# Patient Record
Sex: Female | Born: 1980
Health system: Southern US, Community
[De-identification: ages and names within clinical notes are randomized; demographics above are authoritative.]

## PROBLEM LIST (undated history)

## (undated) DIAGNOSIS — D649 Anemia, unspecified: Secondary | ICD-10-CM

## (undated) DIAGNOSIS — F419 Anxiety disorder, unspecified: Secondary | ICD-10-CM

## (undated) DIAGNOSIS — Z789 Other specified health status: Secondary | ICD-10-CM

## (undated) DIAGNOSIS — L409 Psoriasis, unspecified: Secondary | ICD-10-CM

## (undated) DIAGNOSIS — E785 Hyperlipidemia, unspecified: Secondary | ICD-10-CM

## (undated) HISTORY — DX: Hyperlipidemia, unspecified: E78.5

## (undated) HISTORY — DX: Anemia, unspecified: D64.9

## (undated) HISTORY — DX: Psoriasis, unspecified: L40.9

## (undated) HISTORY — PX: NO PAST SURGERIES: SHX2092

## (undated) HISTORY — DX: Anxiety disorder, unspecified: F41.9

---
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
## 2003-08-19 ENCOUNTER — Other Ambulatory Visit: Admission: RE | Admit: 2003-08-19 | Discharge: 2003-08-19 | Payer: Self-pay | Admitting: Family Medicine

## 2004-09-08 ENCOUNTER — Other Ambulatory Visit: Admission: RE | Admit: 2004-09-08 | Discharge: 2004-09-08 | Payer: Self-pay | Admitting: Family Medicine

## 2009-05-27 ENCOUNTER — Other Ambulatory Visit: Admission: RE | Admit: 2009-05-27 | Discharge: 2009-05-27 | Payer: Self-pay | Admitting: Family Medicine

## 2011-03-10 ENCOUNTER — Other Ambulatory Visit (HOSPITAL_COMMUNITY)
Admission: RE | Admit: 2011-03-10 | Discharge: 2011-03-10 | Disposition: A | Discharge status: Home / Self Care | Payer: 59 | Source: Ambulatory Visit | Attending: Family Medicine | Admitting: Family Medicine

## 2011-03-10 ENCOUNTER — Other Ambulatory Visit: Payer: Self-pay | Admitting: Physician Assistant

## 2011-03-10 DIAGNOSIS — Z124 Encounter for screening for malignant neoplasm of cervix: Secondary | ICD-10-CM | POA: Insufficient documentation

## 2011-04-06 NOTE — L&D Delivery Note (Signed)
Operative Delivery Note At 1:11 PM a viable female was delivered via Vaginal, Vacuum Investment banker, operational).  Used with 2 sets of pushing.  No pop offs.  Pressure remained in green zone at all times.  Released when not pushing.  Presentation: Vertex, compound with left hand Position: Direct OA Station: +2.  Nuchal cord x 1.  Prominent caput.  Scalp abrasion 1 cm.  No bleeding.  Delivery of shoulders delayed slightly.  Left arm delivered, shoulders placed transverse and delivery was successful.  Cord cut and baby placed at bedside. Moving arms bilaterally.  Difficulty breathing, retractions after delivery.  NICU in to assess.  O2 sats normal.  Color good.  B/c breathing was improving, baby allowed to stay skin to skin with precautions.  Verbal consent: and husband.  Risks and benefits discussed in detail.  Risks include, but are not limited to the risks of anesthesia, bleeding, infection, damage to maternal tissues, fetal cephalhematoma.  There is also the risk of inability to effect vaginal delivery of the head, or shoulder dystocia that cannot be resolved by established maneuvers, leading to the need for emergency cesarean section.  APGAR: 7, 8; weight 7 lb 12 oz (3515 g).   Placenta status: Intact, Spontaneous.   Cord: 3 vessels with the following complications: None.  Cord pH: 7.25  Anesthesia: Epidural  Episiotomy: Not performed Lacerations: 2nd degree Suture Repair: 2.0 3.0 chromic Est. Blood Loss (mL): 300  Mom to postpartum.  Baby to skin to skin.Marland Kitchen  Geryl Rankins 02/02/2012, 2:13 PM

## 2011-06-14 LAB — OB RESULTS CONSOLE GC/CHLAMYDIA
Chlamydia: NEGATIVE
Gonorrhea: NEGATIVE

## 2011-06-14 LAB — OB RESULTS CONSOLE ABO/RH: RH Type: POSITIVE

## 2011-06-14 LAB — OB RESULTS CONSOLE HEPATITIS B SURFACE ANTIGEN: Hepatitis B Surface Ag: NEGATIVE

## 2011-06-14 LAB — OB RESULTS CONSOLE RUBELLA ANTIBODY, IGM: Rubella: IMMUNE

## 2011-07-26 ENCOUNTER — Other Ambulatory Visit: Payer: Self-pay | Admitting: Obstetrics and Gynecology

## 2011-07-26 DIAGNOSIS — E049 Nontoxic goiter, unspecified: Secondary | ICD-10-CM

## 2011-07-29 ENCOUNTER — Ambulatory Visit
Admission: RE | Admit: 2011-07-29 | Discharge: 2011-07-29 | Disposition: A | Discharge status: Home / Self Care | Payer: 59 | Source: Ambulatory Visit | Attending: Obstetrics and Gynecology | Admitting: Obstetrics and Gynecology

## 2011-07-29 DIAGNOSIS — E049 Nontoxic goiter, unspecified: Secondary | ICD-10-CM

## 2012-01-31 ENCOUNTER — Inpatient Hospital Stay (HOSPITAL_COMMUNITY)
Admission: AD | Admit: 2012-01-31 | Discharge: 2012-02-04 | DRG: 775 | Disposition: A | Discharge status: Home / Self Care | Payer: Managed Care, Other (non HMO) | Source: Ambulatory Visit | Attending: Obstetrics and Gynecology | Admitting: Obstetrics and Gynecology

## 2012-01-31 ENCOUNTER — Other Ambulatory Visit: Payer: Self-pay | Admitting: Obstetrics and Gynecology

## 2012-01-31 ENCOUNTER — Encounter (HOSPITAL_COMMUNITY): Payer: Self-pay | Admitting: *Deleted

## 2012-01-31 DIAGNOSIS — O139 Gestational [pregnancy-induced] hypertension without significant proteinuria, unspecified trimester: Principal | ICD-10-CM | POA: Diagnosis present

## 2012-01-31 HISTORY — DX: Other specified health status: Z78.9

## 2012-01-31 LAB — CBC
MCH: 28.4 pg (ref 26.0–34.0)
MCHC: 32.9 g/dL (ref 30.0–36.0)
MCV: 86.2 fL (ref 78.0–100.0)
Platelets: 253 10*3/uL (ref 150–400)
RDW: 14.8 % (ref 11.5–15.5)

## 2012-01-31 LAB — COMPREHENSIVE METABOLIC PANEL
AST: 14 U/L (ref 0–37)
Albumin: 2.4 g/dL — ABNORMAL LOW (ref 3.5–5.2)
BUN: 12 mg/dL (ref 6–23)
Calcium: 9.2 mg/dL (ref 8.4–10.5)
Creatinine, Ser: 0.55 mg/dL (ref 0.50–1.10)

## 2012-01-31 LAB — LACTATE DEHYDROGENASE: LDH: 179 U/L (ref 94–250)

## 2012-01-31 LAB — TYPE AND SCREEN: Antibody Screen: NEGATIVE

## 2012-01-31 LAB — URIC ACID: Uric Acid, Serum: 4.9 mg/dL (ref 2.4–7.0)

## 2012-01-31 MED ORDER — PHENYLEPHRINE 40 MCG/ML (10ML) SYRINGE FOR IV PUSH (FOR BLOOD PRESSURE SUPPORT)
80.0000 ug | PREFILLED_SYRINGE | INTRAVENOUS | Status: DC | PRN
  Filled 2012-01-31: qty 5

## 2012-01-31 MED ORDER — EPHEDRINE 5 MG/ML INJ
10.0000 mg | INTRAVENOUS | Status: DC | PRN
  Filled 2012-01-31: qty 4

## 2012-01-31 MED ORDER — CITRIC ACID-SODIUM CITRATE 334-500 MG/5ML PO SOLN
30.0000 mL | ORAL | Status: DC | PRN
  Administered 2012-02-01: 30 mL via ORAL
  Filled 2012-01-31: qty 15

## 2012-01-31 MED ORDER — MISOPROSTOL 25 MCG QUARTER TABLET
25.0000 ug | ORAL_TABLET | ORAL | Status: DC | PRN
  Administered 2012-01-31 – 2012-02-01 (×3): 25 ug via VAGINAL
  Filled 2012-01-31 (×3): qty 0.25

## 2012-01-31 MED ORDER — LACTATED RINGERS IV SOLN
INTRAVENOUS | Status: DC
  Administered 2012-01-31 – 2012-02-02 (×5): via INTRAVENOUS

## 2012-01-31 MED ORDER — DIPHENHYDRAMINE HCL 50 MG/ML IJ SOLN: 12.5000 mg | INTRAMUSCULAR | Status: DC | PRN

## 2012-01-31 MED ORDER — OXYTOCIN 40 UNITS IN LACTATED RINGERS INFUSION - SIMPLE MED: 1.0000 m[IU]/min | INTRAVENOUS | Status: DC

## 2012-01-31 MED ORDER — EPHEDRINE 5 MG/ML INJ: 10.0000 mg | INTRAVENOUS | Status: DC | PRN

## 2012-01-31 MED ORDER — FENTANYL 2.5 MCG/ML BUPIVACAINE 1/10 % EPIDURAL INFUSION (WH - ANES)
14.0000 mL/h | INTRAMUSCULAR | Status: DC
  Administered 2012-02-02: 14 mL/h via EPIDURAL
  Filled 2012-01-31 (×2): qty 125

## 2012-01-31 MED ORDER — PHENYLEPHRINE 40 MCG/ML (10ML) SYRINGE FOR IV PUSH (FOR BLOOD PRESSURE SUPPORT): 80.0000 ug | PREFILLED_SYRINGE | INTRAVENOUS | Status: DC | PRN

## 2012-01-31 MED ORDER — LACTATED RINGERS IV SOLN
500.0000 mL | Freq: Once | INTRAVENOUS | Status: AC
  Administered 2012-02-02: 500 mL via INTRAVENOUS

## 2012-01-31 MED ORDER — ACETAMINOPHEN 325 MG PO TABS
650.0000 mg | ORAL_TABLET | ORAL | Status: DC | PRN
  Administered 2012-02-02: 650 mg via ORAL
  Filled 2012-01-31: qty 2

## 2012-01-31 MED ORDER — LACTATED RINGERS IV SOLN: 500.0000 mL | INTRAVENOUS | Status: DC | PRN

## 2012-01-31 MED ORDER — ONDANSETRON HCL 4 MG/2ML IJ SOLN: 4.0000 mg | Freq: Four times a day (QID) | INTRAMUSCULAR | Status: DC | PRN

## 2012-01-31 MED ORDER — LIDOCAINE HCL (PF) 1 % IJ SOLN
30.0000 mL | INTRAMUSCULAR | Status: DC | PRN
  Filled 2012-01-31: qty 30

## 2012-01-31 MED ORDER — IBUPROFEN 600 MG PO TABS: 600.0000 mg | ORAL_TABLET | Freq: Four times a day (QID) | ORAL | Status: DC | PRN

## 2012-01-31 MED ORDER — TERBUTALINE SULFATE 1 MG/ML IJ SOLN: 0.2500 mg | Freq: Once | INTRAMUSCULAR | Status: AC | PRN

## 2012-01-31 MED ORDER — OXYTOCIN BOLUS FROM INFUSION
500.0000 mL | INTRAVENOUS | Status: DC
  Filled 2012-01-31 (×142): qty 500

## 2012-01-31 MED ORDER — BUTORPHANOL TARTRATE 1 MG/ML IJ SOLN
1.0000 mg | INTRAMUSCULAR | Status: DC | PRN
  Administered 2012-02-01 – 2012-02-02 (×5): 1 mg via INTRAVENOUS
  Filled 2012-01-31 (×6): qty 1

## 2012-01-31 MED ORDER — OXYTOCIN 40 UNITS IN LACTATED RINGERS INFUSION - SIMPLE MED: 62.5000 mL/h | INTRAVENOUS | Status: DC

## 2012-01-31 MED ORDER — FLEET ENEMA 7-19 GM/118ML RE ENEM: 1.0000 | ENEMA | RECTAL | Status: DC | PRN

## 2012-01-31 MED ORDER — ZOLPIDEM TARTRATE 5 MG PO TABS
5.0000 mg | ORAL_TABLET | Freq: Every evening | ORAL | Status: DC | PRN
  Administered 2012-01-31: 5 mg via ORAL
  Filled 2012-01-31: qty 1

## 2012-01-31 MED ORDER — OXYTOCIN 40 UNITS IN LACTATED RINGERS INFUSION - SIMPLE MED
1.0000 m[IU]/min | INTRAVENOUS | Status: DC
  Administered 2012-02-01: 2 m[IU]/min via INTRAVENOUS
  Administered 2012-02-02: 13:00:00 via INTRAVENOUS
  Filled 2012-01-31: qty 1000

## 2012-01-31 MED ORDER — OXYCODONE-ACETAMINOPHEN 5-325 MG PO TABS: 1.0000 | ORAL_TABLET | ORAL | Status: DC | PRN

## 2012-01-31 NOTE — Progress Notes (Signed)
Tracey Kent is a 31 y.o. G1P0000 at [redacted]w[redacted]d byadmitted for induction of labor due to Hypertension.  Subjective: Comfortable and no preeclamptic symptoms of HA N V RUQ Pain  Objective: BP 141/92  Pulse 108  Temp 98.9 F (37.2 C) (Oral)  Resp 18  Ht 5' 1.75" (1.568 m)  Wt 103.874 kg (229 lb)  BMI 42.22 kg/m2      FHT:  FHR: 140-150s bpm, variability: moderate,  accelerations:  Present,  decelerations:  Absent UC:   irregular, every 5-10 minutes SVE:   Dilation: Closed Effacement (%): 40 Station: -2 Exam by:: Cammy Copa, RN  Labs: Lab Results  Component Value Date   WBC 9.7 01/31/2012   HGB 11.1* 01/31/2012   HCT 33.7* 01/31/2012   MCV 86.2 01/31/2012   PLT 253 01/31/2012   CBC    Component Value Date/Time   WBC 9.7 01/31/2012 1550   RBC 3.91 01/31/2012 1550   HGB 11.1* 01/31/2012 1550   HCT 33.7* 01/31/2012 1550   PLT 253 01/31/2012 1550   MCV 86.2 01/31/2012 1550   MCH 28.4 01/31/2012 1550   MCHC 32.9 01/31/2012 1550   RDW 14.8 01/31/2012 1550   CMP     Component Value Date/Time   NA 136 01/31/2012 1550   K 4.2 01/31/2012 1550   CL 102 01/31/2012 1550   CO2 22 01/31/2012 1550   GLUCOSE 115* 01/31/2012 1550   BUN 12 01/31/2012 1550   CREATININE 0.55 01/31/2012 1550   CALCIUM 9.2 01/31/2012 1550   PROT 5.5* 01/31/2012 1550   ALBUMIN 2.4* 01/31/2012 1550   AST 14 01/31/2012 1550   ALT 11 01/31/2012 1550   ALKPHOS 97 01/31/2012 1550   BILITOT 0.1* 01/31/2012 1550   GFRNONAA >90 01/31/2012 1550   GFRAA >90 01/31/2012 1550       Assessment / Plan: cervical ripening in unfavorable cervix with PIH  Labor: early  Preeclampsia:  24 hour urine pending Fetal Wellbeing:  Category I Pain Control:    I/D:  n/a Anticipated MOD:     Amanada Philbrick H. 01/31/2012, 10:49 PM

## 2012-02-01 ENCOUNTER — Encounter (HOSPITAL_COMMUNITY): Payer: Self-pay | Admitting: *Deleted

## 2012-02-01 LAB — RPR: RPR Ser Ql: NONREACTIVE

## 2012-02-01 LAB — CBC
HCT: 34 % — ABNORMAL LOW (ref 36.0–46.0)
Hemoglobin: 11.4 g/dL — ABNORMAL LOW (ref 12.0–15.0)
MCH: 28.9 pg (ref 26.0–34.0)
RBC: 3.94 MIL/uL (ref 3.87–5.11)

## 2012-02-01 NOTE — Progress Notes (Signed)
Discussed with Dr. Christell Constant regarding contraction pattern and inability to place next cytotec due to protocol. Dr. Christell Constant stated that he would continue to watch.

## 2012-02-01 NOTE — H&P (Signed)
Tracey Kent is a 31 y.o. female G1 at 1 6/7 weeks with h/o Gestational HTN admitted for induction of labor.  GHTN diagnosed at 37+ weeks.  NST twice weekly have been reassuring and reactive.  AFI one week ago was 18 cm, BPP 8/8.  Labs have been normal.  Initial 24 hour urine was 152, urine dips have been negative for protein x 3 weeks.  Pt has been without symptoms of preeclampsia.  Today, she continues to deny having headaches, visual changes or RUQ pain. Pt has had contractions irregularly on her NSTs.  Today in the office she reports feeling contractions but mild.  Denis LOF, VB.  She reports fetus has been active. Prior to Gestational HTN, her pregnancy was uncomplicated.     Maternal Medical History:  Contractions: Onset was more than 2 days ago.   Frequency: irregular.   Perceived severity is mild.    Fetal activity: Perceived fetal activity is normal.    Prenatal complications: Hypertension.   Prenatal Complications - Diabetes: none.    OB History    Grav Para Term Preterm Abortions TAB SAB Ect Mult Living   1 0 0 0 0 0 0 0 0 0      Past Medical History  Diagnosis Date  . No pertinent past medical history    Past Surgical History  Procedure Date  . No past surgeries    Family History: family history is not on file. Social History:  reports that she has never smoked. She has never used smokeless tobacco. She reports that she does not drink alcohol or use illicit drugs.   Prenatal Transfer Tool  Maternal Diabetes: No Genetic Screening: Declined Maternal Ultrasounds/Referrals: Normal Fetal Ultrasounds or other Referrals:  None Maternal Substance Abuse:  No Significant Maternal Medications:  None Significant Maternal Lab Results:  Lab values include: Group B Strep negative Other Comments:  Pt with Gestational HTN at 37 weeks.  Review of Systems  Eyes: Negative for blurred vision.  Respiratory: Negative for shortness of breath.   Gastrointestinal: Negative for  abdominal pain.  Neurological: Negative for headaches.    Dilation: Fingertip Effacement (%): 50 Station: -2 Exam by:: Cammy Copa, RN Blood pressure 141/92, pulse 108, temperature 98.9 F (37.2 C), temperature source Oral, resp. rate 18, height 5' 1.75" (1.568 m), weight 103.874 kg (229 lb). Maternal Exam:  Uterine Assessment: Contraction frequency is irregular.   Abdomen: Estimated fetal weight is 8 1/2 pounds.   Fetal presentation: vertex  Introitus: Normal vulva. Vulva is negative for condylomata and lesion.  Normal vagina.  Ferning test: not done.  Nitrazine test: not done. Amniotic fluid character: not assessed.  Pelvis: questionable for delivery.   May deliver 7-8 pound baby but not larger. Cervix: Cervix evaluated by digital exam.     Fetal Exam Fetal Monitor Review: Baseline rate: 140s reactive.  Variability: moderate (6-25 bpm).   Pattern: no decelerations.    Fetal State Assessment: Category I - tracings are normal.     Physical Exam  Constitutional: She is oriented to person, place, and time. She appears well-developed and well-nourished. No distress.  HENT:  Head: Normocephalic and atraumatic.  Eyes: EOM are normal. Right eye exhibits no discharge.  Neck: Normal range of motion.  Cardiovascular: Normal rate, regular rhythm and normal heart sounds.   No murmur heard. Respiratory: Effort normal and breath sounds normal. No respiratory distress.  GI: Soft. There is no tenderness.  Genitourinary: Vagina normal. Vulva exhibits no lesion.  Musculoskeletal: Normal range of  motion. She exhibits no edema.  Neurological: She is alert and oriented to person, place, and time. She has normal reflexes. She displays normal reflexes.  Skin: Skin is warm and dry. She is not diaphoretic.  Psychiatric: She has a normal mood and affect.    Prenatal labs: ABO, Rh: --/--/A POS, A POS (10/28 1550) Antibody: NEG (10/28 1550) Rubella: Immune (03/11 0000) RPR:  Nonreactive (03/11 0000)  HBsAg: Negative (03/11 0000)  HIV: Non-reactive (03/11 0000)  GBS: Negative (09/30 0000)   Assessment/Plan: IUP at 39 6/7 weeks.  H/o Gestational HTN now with elevated BP. Admitted for induction of labor.  Cervix unfavorable.  Ripen with Cytotec, start Pitocin in am. Normal labs.   BP elevated.  Awaiting 24 hour urine protein result.  Magnesium Sulfate prn persistently elevated  BP. LGA.  Monitor labor curve closely when in active labor.  Tracey Kent 02/01/2012, 1:14 AM

## 2012-02-01 NOTE — Progress Notes (Signed)
Svea Belcastro is a 31 y.o. G1P0000 at [redacted]w[redacted]d by admitted for induction of labor due to Hypertension.  Subjective: Pt denies ha/ blurred vision or ruq pain .... She reports pelvic pain in the right lower qudrant that is intermittent and occurs even if she is not having a contraction. She is hungry. Positive fetal movement. No lof no vaginal bleeding.   Objective: BP 144/90  Pulse 105  Temp 98.1 F (36.7 C) (Oral)  Resp 20  Ht 5' 1.75" (1.568 m)  Wt 103.874 kg (229 lb)  BMI 42.22 kg/m2      FHT:  FHR: 140 bpm, variability: moderate,  accelerations:  Present,  decelerations:  Absent UC:   regular, every 2-4  minutes SVE:   Dilation: 2 Effacement (%): 80 Station: -3;-2 Exam by:: Dr. Richardson Dopp  Labs: Lab Results  Component Value Date   WBC 14.4* 02/01/2012   HGB 11.4* 02/01/2012   HCT 34.0* 02/01/2012   MCV 86.3 02/01/2012   PLT 256 02/01/2012    Assessment / Plan: Induction of labor due to gestational hypertension... Pt is not in active labor.. Plan to stop pitocin for 1 hr and allow patient to eat light dinner.Marland Kitchen Restart pitocin at 8 pm   Labor: latent labor..  Preeclampsia:  labs stable Fetal Wellbeing:  Category I Pain Control:  stadol prn .. epidural upon request  I/D:  n/a Anticipated MOD:  NSVD  Kamiah Fite J. 02/01/2012, 6:46 PM

## 2012-02-01 NOTE — Progress Notes (Signed)
Delorse Harte is a 31 y.o. G1P0000 at [redacted]w[redacted]d admitted for induction of labor due to Gestational HTN.  Subjective: Pt is uncomfortable.  Desires pain relief.  Denies LOF or VB.  Denies HA or visual changes.  Objective: BP 126/92  Pulse 112  Temp 98.1 F (36.7 C) (Oral)  Resp 20  Ht 5' 1.75" (1.568 m)  Wt 103.874 kg (229 lb)  BMI 42.22 kg/m2     Gen:  Moderate discomfort with ambulation.  Comfortable in right lateral decubitus. CV:  RRR Lungs: CTA bilaterally Abd:  No RUQ tenderness Neuro:  DTR 2+  FHT:  Good variability, Reactive, spont decel x 2-3 minutes. UC:   regular, every 2-4 minutes SVE:   2/70/-3.   Labs: Lab Results  Component Value Date   WBC 14.4* 02/01/2012   HGB 11.4* 02/01/2012   HCT 34.0* 02/01/2012   MCV 86.3 02/01/2012   PLT 256 02/01/2012   24 hour urine protein 167 Assessment / Plan: IUP at 40 0/7 weeks, reassuring fetal status Gestational HTN.  Preeclampsia ruled out- 24 hour urine protein less than 300 mg.  BP mildly elevated. Continue Pitocin, due for increase now. Stadol for now due to latent labor.  Epidural prn. Dr. Richardson Dopp was given report on pt and will assume care until tomorrow am.   Geryl Rankins 02/01/2012, 3:03 PM

## 2012-02-01 NOTE — Progress Notes (Signed)
Tracey Kent is a 31 y.o. G1P0000 at [redacted]w[redacted]d admitted for induction of labor due to Gestational HTN.  Subjective: Cytotec held overnight due to contractions.  Last dose placed ~0600. Pt comfortable.  She reports overnight she had moderate to severe pain, relieved with Stadol.  Got Ambien once then Stadol x 2 doses.  Denies HA, visual changes or RUQ pain.  Denies LOF or VB.    Objective: BP 131/81  Pulse 114  Temp 97.8 F (36.6 C) (Oral)  Resp 20  Ht 5' 1.75" (1.568 m)  Wt 103.874 kg (229 lb)  BMI 42.22 kg/m2     Gen: NAD, comfortable CV:  RRR Lungs:  CTA bilaterally, no crackles Abdomen:  Nontender DTR 2+ FHT:  Reactive, no decels UC:   Irregular, as frequent as q 2-3 minutes SVE:   1-2/70/-3  Labs: Lab Results  Component Value Date   WBC 9.7 01/31/2012   HGB 11.1* 01/31/2012   HCT 33.7* 01/31/2012   MCV 86.2 01/31/2012   PLT 253 01/31/2012    Assessment / Plan: Gestational HTN at Term Elevated BP overnight due to pain.  BP normal now. Awaiting 24 hour urine results. If pt is not preeclamptic, consider PO medication for BP if moderately elevated consistently.   Labor: Progressing normally Preeclampsia:  Elevated BP but labs and exam normal.  Awaiting 24 hour urine protein results. Fetal Wellbeing:  Category I Pain Control:  Stadol and epidural per pt request ordered. I/D:  GBS negative Anticipated MOD:  NSVD  Tracey Kent 02/01/2012, 8:30 AM

## 2012-02-02 ENCOUNTER — Encounter (HOSPITAL_COMMUNITY): Payer: Self-pay | Admitting: Anesthesiology

## 2012-02-02 ENCOUNTER — Encounter (HOSPITAL_COMMUNITY): Payer: Self-pay | Admitting: *Deleted

## 2012-02-02 ENCOUNTER — Inpatient Hospital Stay (HOSPITAL_COMMUNITY): Payer: Managed Care, Other (non HMO) | Admitting: Anesthesiology

## 2012-02-02 LAB — CBC
HCT: 34 % — ABNORMAL LOW (ref 36.0–46.0)
MCH: 28.8 pg (ref 26.0–34.0)
MCV: 87.4 fL (ref 78.0–100.0)
RBC: 3.89 MIL/uL (ref 3.87–5.11)
WBC: 12.9 10*3/uL — ABNORMAL HIGH (ref 4.0–10.5)

## 2012-02-02 MED ORDER — DIPHENHYDRAMINE HCL 25 MG PO CAPS: 25.0000 mg | ORAL_CAPSULE | Freq: Four times a day (QID) | ORAL | Status: DC | PRN

## 2012-02-02 MED ORDER — SODIUM CHLORIDE 0.9 % IV SOLN
3.0000 g | Freq: Once | INTRAVENOUS | Status: AC
  Administered 2012-02-02: 3 g via INTRAVENOUS
  Filled 2012-02-02: qty 3

## 2012-02-02 MED ORDER — FENTANYL 2.5 MCG/ML BUPIVACAINE 1/10 % EPIDURAL INFUSION (WH - ANES)
INTRAMUSCULAR | Status: DC | PRN
  Administered 2012-02-02: 14 mL/h via EPIDURAL

## 2012-02-02 MED ORDER — SIMETHICONE 80 MG PO CHEW: 80.0000 mg | CHEWABLE_TABLET | ORAL | Status: DC | PRN

## 2012-02-02 MED ORDER — FERROUS SULFATE 325 (65 FE) MG PO TABS
325.0000 mg | ORAL_TABLET | Freq: Two times a day (BID) | ORAL | Status: DC
  Administered 2012-02-02 – 2012-02-04 (×4): 325 mg via ORAL
  Filled 2012-02-02 (×3): qty 1

## 2012-02-02 MED ORDER — ONDANSETRON HCL 4 MG/2ML IJ SOLN: 4.0000 mg | INTRAMUSCULAR | Status: DC | PRN

## 2012-02-02 MED ORDER — DIBUCAINE 1 % RE OINT: 1.0000 "application " | TOPICAL_OINTMENT | RECTAL | Status: DC | PRN

## 2012-02-02 MED ORDER — ACETAMINOPHEN 325 MG PO TABS: 650.0000 mg | ORAL_TABLET | Freq: Four times a day (QID) | ORAL | Status: DC | PRN

## 2012-02-02 MED ORDER — FAMOTIDINE 20 MG PO TABS
20.0000 mg | ORAL_TABLET | Freq: Two times a day (BID) | ORAL | Status: DC
  Administered 2012-02-02 – 2012-02-04 (×4): 20 mg via ORAL
  Filled 2012-02-02 (×5): qty 1

## 2012-02-02 MED ORDER — PRENATAL MULTIVITAMIN CH
1.0000 | ORAL_TABLET | Freq: Every day | ORAL | Status: DC
  Administered 2012-02-02 – 2012-02-04 (×3): 1 via ORAL
  Filled 2012-02-02 (×4): qty 1

## 2012-02-02 MED ORDER — MAGNESIUM HYDROXIDE 400 MG/5ML PO SUSP: 30.0000 mL | ORAL | Status: DC | PRN

## 2012-02-02 MED ORDER — LANOLIN HYDROUS EX OINT: TOPICAL_OINTMENT | CUTANEOUS | Status: DC | PRN

## 2012-02-02 MED ORDER — IBUPROFEN 600 MG PO TABS
600.0000 mg | ORAL_TABLET | Freq: Four times a day (QID) | ORAL | Status: DC
  Administered 2012-02-02 – 2012-02-04 (×7): 600 mg via ORAL
  Filled 2012-02-02 (×7): qty 1

## 2012-02-02 MED ORDER — OXYCODONE-ACETAMINOPHEN 5-325 MG PO TABS: 1.0000 | ORAL_TABLET | ORAL | Status: DC | PRN

## 2012-02-02 MED ORDER — LIDOCAINE HCL (PF) 1 % IJ SOLN
INTRAMUSCULAR | Status: DC | PRN
  Administered 2012-02-02: 9 mL
  Administered 2012-02-02: 30 mL
  Administered 2012-02-02: 9 mL

## 2012-02-02 MED ORDER — OXYTOCIN 40 UNITS IN LACTATED RINGERS INFUSION - SIMPLE MED: 62.5000 mL/h | INTRAVENOUS | Status: DC | PRN

## 2012-02-02 MED ORDER — WITCH HAZEL-GLYCERIN EX PADS
1.0000 "application " | MEDICATED_PAD | CUTANEOUS | Status: DC | PRN
  Administered 2012-02-02: 1 via TOPICAL

## 2012-02-02 MED ORDER — ZOLPIDEM TARTRATE 5 MG PO TABS: 5.0000 mg | ORAL_TABLET | Freq: Every evening | ORAL | Status: DC | PRN

## 2012-02-02 MED ORDER — SENNOSIDES-DOCUSATE SODIUM 8.6-50 MG PO TABS
2.0000 | ORAL_TABLET | Freq: Every day | ORAL | Status: DC
  Administered 2012-02-02 – 2012-02-03 (×2): 2 via ORAL

## 2012-02-02 MED ORDER — ONDANSETRON HCL 4 MG PO TABS: 4.0000 mg | ORAL_TABLET | ORAL | Status: DC | PRN

## 2012-02-02 MED ORDER — BENZOCAINE-MENTHOL 20-0.5 % EX AERO
1.0000 "application " | INHALATION_SPRAY | CUTANEOUS | Status: DC | PRN
  Administered 2012-02-02: 1 via TOPICAL
  Filled 2012-02-02: qty 56

## 2012-02-02 MED ORDER — TETANUS-DIPHTH-ACELL PERTUSSIS 5-2.5-18.5 LF-MCG/0.5 IM SUSP
0.5000 mL | Freq: Once | INTRAMUSCULAR | Status: AC
  Administered 2012-02-03: 0.5 mL via INTRAMUSCULAR
  Filled 2012-02-02: qty 0.5

## 2012-02-02 NOTE — Anesthesia Preprocedure Evaluation (Signed)
Anesthesia Evaluation  Patient identified by MRN, date of birth, ID band Patient awake    Reviewed: Allergy & Precautions, H&P , NPO status , Patient's Chart, lab work & pertinent test results  Airway Mallampati: III TM Distance: >3 FB Neck ROM: full    Dental No notable dental hx.    Pulmonary neg pulmonary ROS,    Pulmonary exam normal       Cardiovascular negative cardio ROS      Neuro/Psych negative neurological ROS  negative psych ROS   GI/Hepatic negative GI ROS, Neg liver ROS,   Endo/Other  Morbid obesity  Renal/GU negative Renal ROS  negative genitourinary   Musculoskeletal negative musculoskeletal ROS (+)   Abdominal (+) + obese,   Peds negative pediatric ROS (+)  Hematology negative hematology ROS (+)   Anesthesia Other Findings   Reproductive/Obstetrics (+) Pregnancy                           Anesthesia Physical Anesthesia Plan  ASA: III  Anesthesia Plan: Epidural   Post-op Pain Management:    Induction:   Airway Management Planned:   Additional Equipment:   Intra-op Plan:   Post-operative Plan:   Informed Consent: I have reviewed the patients History and Physical, chart, labs and discussed the procedure including the risks, benefits and alternatives for the proposed anesthesia with the patient or authorized representative who has indicated his/her understanding and acceptance.     Plan Discussed with:   Anesthesia Plan Comments:         Anesthesia Quick Evaluation  

## 2012-02-02 NOTE — Anesthesia Procedure Notes (Signed)
Epidural Patient location during procedure: OB Start time: 02/02/2012 4:04 AM End time: 02/02/2012 4:07 AM  Staffing Anesthesiologist: Sandrea Hughs Performed by: anesthesiologist   Preanesthetic Checklist Completed: patient identified, site marked, surgical consent, pre-op evaluation, timeout performed, IV checked, risks and benefits discussed and monitors and equipment checked  Epidural Patient position: sitting Prep: site prepped and draped and DuraPrep Patient monitoring: continuous pulse ox and blood pressure Approach: midline Injection technique: LOR air  Needle:  Needle type: Tuohy  Needle gauge: 17 G Needle length: 9 cm and 9 Needle insertion depth: 6 cm Catheter type: closed end flexible Catheter size: 19 Gauge Catheter at skin depth: 11 cm Test dose: negative and Other  Assessment Sensory level: T8 Events: blood not aspirated, injection not painful, no injection resistance, negative IV test and no paresthesia  Additional Notes Reason for block:procedure for pain

## 2012-02-02 NOTE — Progress Notes (Signed)
Tracey Kent is a 31 y.o. G1P0000 at [redacted]w[redacted]d admitted for induction of labor due to Hypertension.  Subjective: Pt with epidural.  SROM ~ 4 hours ago. Comfortable s/p epidural.  Denies HA visual changes.  Objective: BP 162/66  Pulse 122  Temp 100.1 F (37.8 C) (Axillary)  Resp 20  Ht 5' 1.75" (1.568 m)  Wt 103.874 kg (229 lb)  BMI 42.22 kg/m2  SpO2 95%   Total I/O In: -  Out: 150 [Urine:150] Lungs: Clear Neuro: DTR 2+  FHT:  Reactive, good variablity SVE:   Dilation: 10 Effacement (%): 100 Station: 0 Exam by:: Karoline Fleer Anterior fornix near symphysis pubis edematous.  Narrow arch but enough room posteriorly for delivery.  Head not consistent with macrosomia.  No caput.     Labs: Lab Results  Component Value Date   WBC 12.9* 02/02/2012   HGB 11.2* 02/02/2012   HCT 34.0* 02/02/2012   MCV 87.4 02/02/2012   PLT 255 02/02/2012    Assessment / Plan: Complete dilitation Labor down to +2 station then push. Fever of 101.0.  Start Unasyn.  Tylenol for fever.  Baby not tacycardic. Labor: Progressing normally Preeclampsia:  no signs or symptoms of toxicity and labs stable  BP intermittently elevated. Fetal Wellbeing:  Category I Pain Control:  Epidural I/D:  Fever, Unasyn Anticipated MOD:  Await progression with pushing.  Narrow arch.  Geryl Rankins 02/02/2012, 8:58 AM

## 2012-02-03 LAB — CBC
MCV: 87.5 fL (ref 78.0–100.0)
Platelets: 204 10*3/uL (ref 150–400)
RDW: 15.1 % (ref 11.5–15.5)
WBC: 15 10*3/uL — ABNORMAL HIGH (ref 4.0–10.5)

## 2012-02-03 NOTE — Anesthesia Postprocedure Evaluation (Signed)
Anesthesia Post Note  Patient: Tracey Kent  Procedure(s) Performed: * No procedures listed *  Anesthesia type: Epidural  Patient location: Mother/Baby  Post pain: Pain level controlled  Post assessment: Post-op Vital signs reviewed  Last Vitals:  Filed Vitals:   02/03/12 1611  BP: 137/75  Pulse: 75  Temp:   Resp: 20    Post vital signs: Reviewed  Level of consciousness: awake  Complications: No apparent anesthesia complications

## 2012-02-03 NOTE — Progress Notes (Signed)
In to assess pt.  No complaints.  Denies symptoms of preeclampsia. BP mildly elevated, some severe PE wnl.  DTR 2+, Lungs clear. Routine PP care.  VS q 4 hours instead of routine.  Procardia XL 30 mg prn for persistently elevated BP.

## 2012-02-03 NOTE — Progress Notes (Signed)
Post Partum Day 1 s/p vaginal delivery  Subjective: no complaints, up ad lib, voiding and tolerating PO  Objective: Blood pressure 130/85, pulse 98, temperature 98.1 F (36.7 C), temperature source Oral, resp. rate 16, height 5' 1.75" (1.568 m), weight 103.874 kg (229 lb), SpO2 97.00%, unknown if currently breastfeeding.  Physical Exam:  General: alert and cooperative Lochia: appropriate Uterine Fundus: firm Incision: NA DVT Evaluation: No evidence of DVT seen on physical exam.   Basename 02/03/12 0550 02/02/12 0250  HGB 9.1* 11.2*  HCT 27.3* 34.0*    Assessment/Plan: Plan for discharge tomorrow and Breastfeeding   LOS: 3 days   Shreyas Piatkowski J. 02/03/2012, 10:38 AM

## 2012-02-04 MED ORDER — IBUPROFEN 600 MG PO TABS: 600.0000 mg | ORAL_TABLET | Freq: Four times a day (QID) | ORAL | Status: DC | PRN

## 2012-02-04 MED ORDER — FERROUS SULFATE 325 (65 FE) MG PO TABS: 325.0000 mg | ORAL_TABLET | Freq: Two times a day (BID) | ORAL | Status: DC

## 2012-02-04 NOTE — Progress Notes (Signed)
Post Partum Day 2 s/p svd Subjective: no complaints, up ad lib, voiding and tolerating PO  Objective: Blood pressure 131/66, pulse 108, temperature 98.2 F (36.8 C), temperature source Oral, resp. rate 20, height 5' 1.75" (1.568 m), weight 103.874 kg (229 lb), SpO2 98.00%, unknown if currently breastfeeding.  Physical Exam:  General: alert and cooperative Lochia: appropriate Uterine Fundus: firm Incision: NA DVT Evaluation: No evidence of DVT seen on physical exam.   Basename 02/03/12 0550 02/02/12 0250  HGB 9.1* 11.2*  HCT 27.3* 34.0*    Assessment/Plan: Discharge home and Circumcision prior to discharge   LOS: 4 days   Tracey Kent J. 02/04/2012, 8:07 AM

## 2012-02-04 NOTE — Discharge Summary (Signed)
Obstetric Discharge Summary Reason for Admission: induction of labor Prenatal Procedures: none Intrapartum Procedures: vacuum Postpartum Procedures: none Complications-Operative and Postpartum: none Hemoglobin  Date Value Range Status  02/03/2012 9.1* 12.0 - 15.0 g/dL Final     DELTA CHECK NOTED     REPEATED TO VERIFY     HCT  Date Value Range Status  02/03/2012 27.3* 36.0 - 46.0 % Final    Physical Exam:  General: alert, cooperative and appears stated age 31: appropriate Uterine Fundus: firm Incision: na DVT Evaluation: No evidence of DVT seen on physical exam.  Discharge Diagnoses: Term Pregnancy-delivered  Discharge Information: Date: 02/04/2012 Activity: pelvic rest Diet: routine Medications: PNV, Ibuprofen and Iron Condition: stable Instructions: refer to practice specific booklet Discharge to: home Follow-up Information    Follow up with Geryl Rankins, MD. Schedule an appointment as soon as possible for a visit in 1 week. (BP check)    Contact information:   [address], [address] [place] [address] [phone]          Newborn Data: Live born female  Birth Weight: 7 lb 12 oz (3515 g) APGAR: 7, 8  Home with mother.  Cyan Clippinger J. 02/04/2012, 8:11 AM

## 2012-02-04 NOTE — Progress Notes (Signed)
0100 am infant was assessed and head very tender and infant crying. Mother trying to [facility] infant. 0200 am Pt vs taken , pt very upset over infant  Not quit crying.  B/P elevated, Dystolic 99. VS repeated at 0300 am and b/p 152/81, hr-118,resp 20. Pt crying and upset over infant crying and unable to relief pain with positioning. Pt. Husband holding infant on chest and instructed to call RN IF HE BECOMES TIRED AND SLEEPY.

## 2012-02-04 NOTE — Progress Notes (Signed)
Gel pad obtained from NICU for infant head to relieve soreness and pressure  From bruising , laceration and edema from vacuum extraction at delivery

## 2012-02-14 ENCOUNTER — Telehealth (HOSPITAL_COMMUNITY): Payer: Self-pay | Admitting: *Deleted

## 2012-02-14 NOTE — Telephone Encounter (Signed)
Resolve episode 

## 2012-03-13 ENCOUNTER — Other Ambulatory Visit (HOSPITAL_COMMUNITY)
Admission: RE | Admit: 2012-03-13 | Discharge: 2012-03-13 | Disposition: A | Discharge status: Home / Self Care | Payer: Managed Care, Other (non HMO) | Source: Ambulatory Visit | Attending: Obstetrics and Gynecology | Admitting: Obstetrics and Gynecology

## 2012-03-13 ENCOUNTER — Other Ambulatory Visit: Payer: Self-pay | Admitting: Obstetrics and Gynecology

## 2012-03-13 DIAGNOSIS — Z1151 Encounter for screening for human papillomavirus (HPV): Secondary | ICD-10-CM | POA: Insufficient documentation

## 2012-03-13 DIAGNOSIS — Z01419 Encounter for gynecological examination (general) (routine) without abnormal findings: Secondary | ICD-10-CM | POA: Insufficient documentation

## 2013-03-19 IMAGING — US US SOFT TISSUE HEAD/NECK
1 series · 14 of 25 positions shown · non-contrast
Comparison: None.

CLINICAL DATA: Thyromegaly.

THYROID ULTRASOUND
TECHNIQUE: Ultrasound examination of the thyroid gland and adjacent
soft tissues was performed.

[Series 1: us soft tissue head/neck · 0.08mm/px · 14 of 48 slices shown]
[im 1/48]
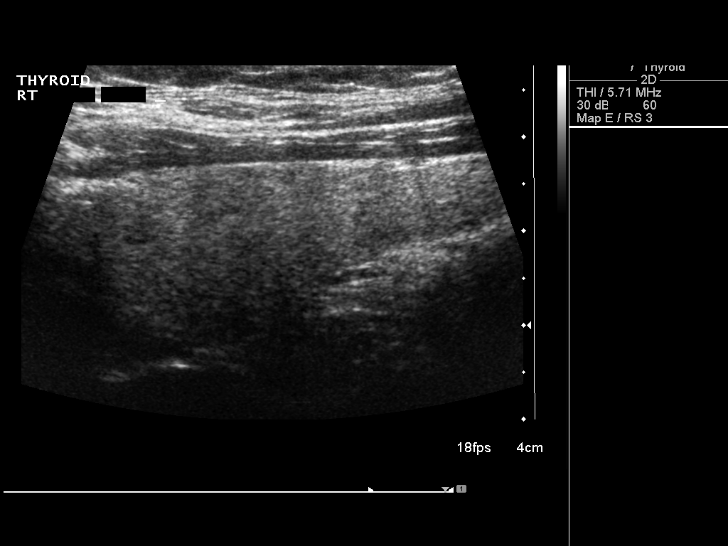
[im 4/48]
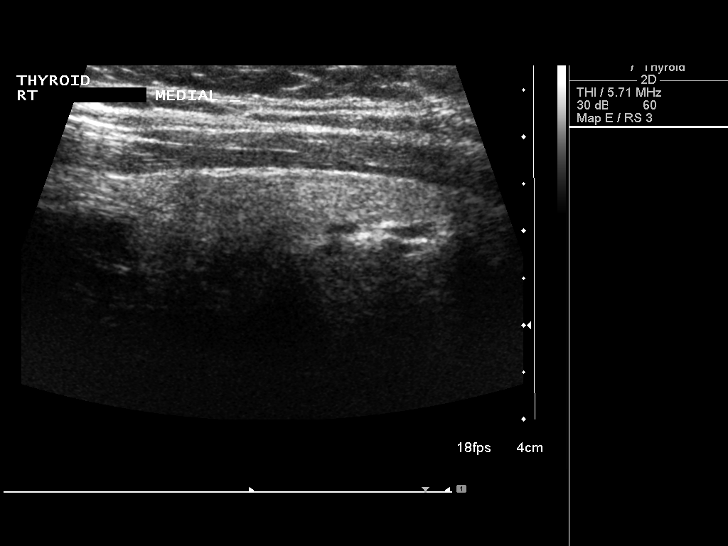
[im 8/48]
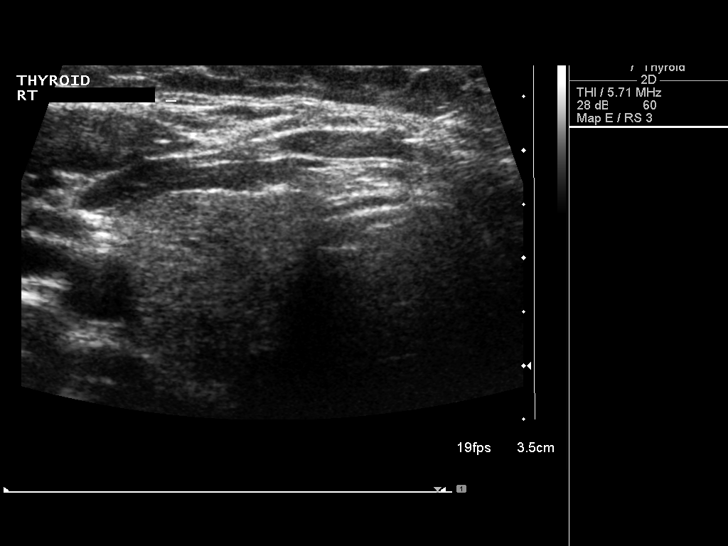
[im 12/48]
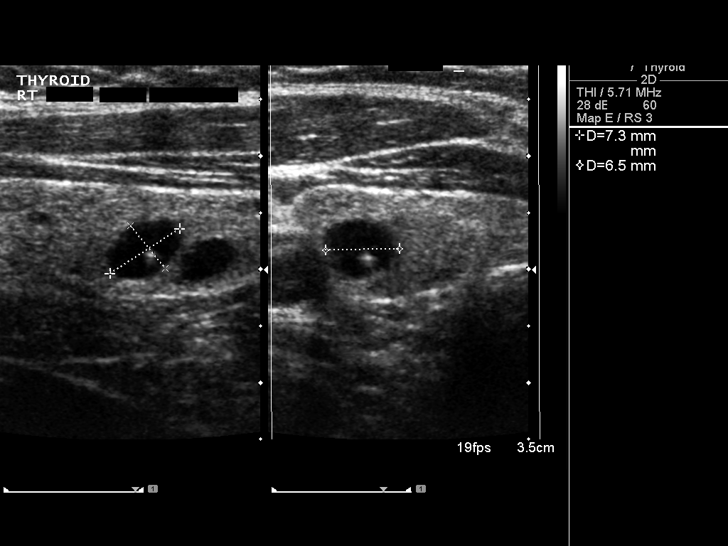
[im 16/48]
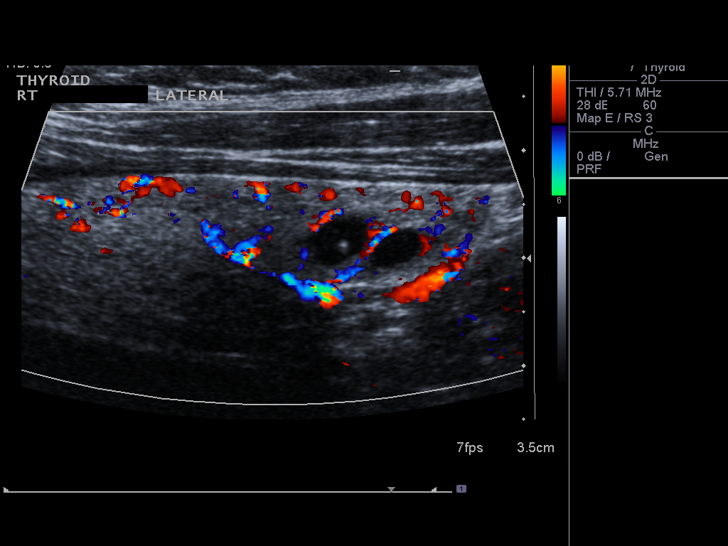
[im 18/48]
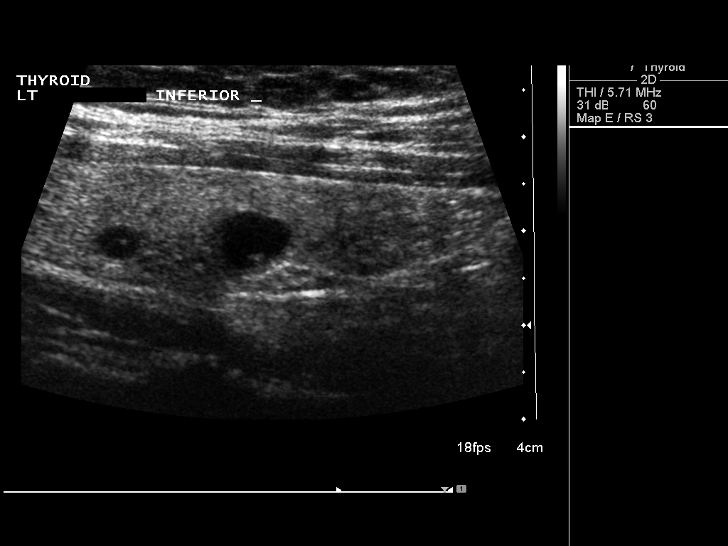
[im 22/48]
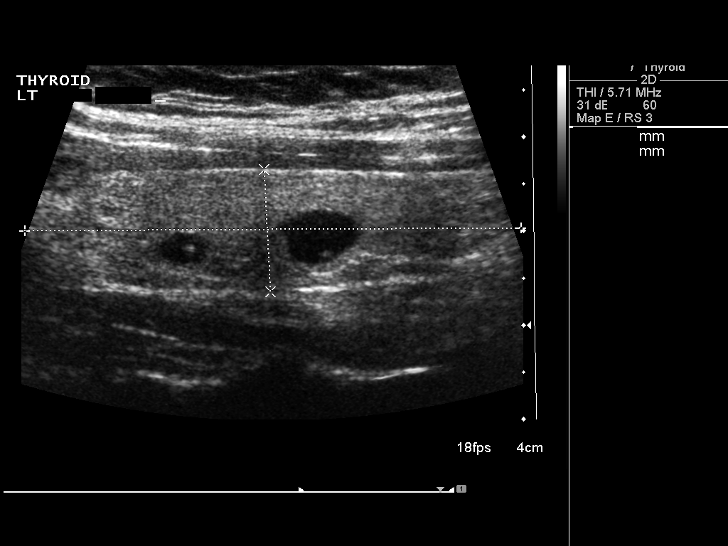
[im 26/48]
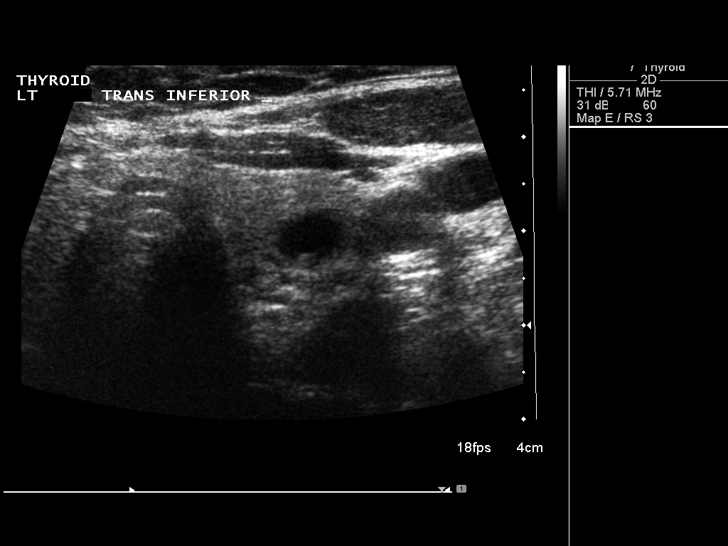
[im 30/48]
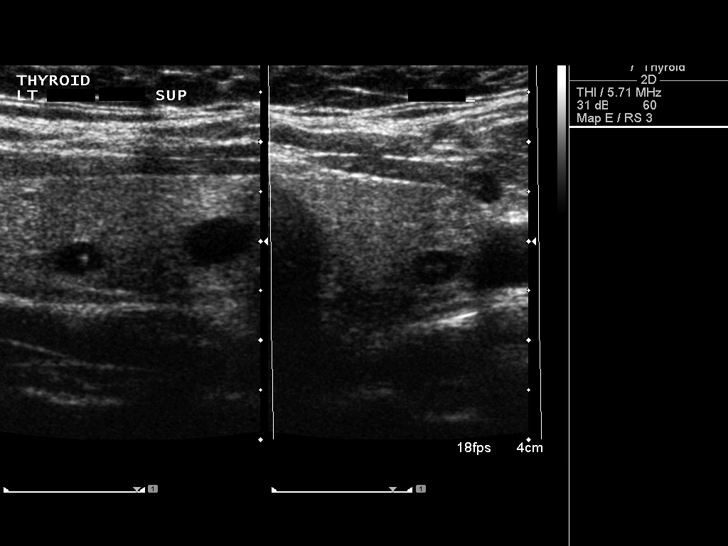
[im 32/48]
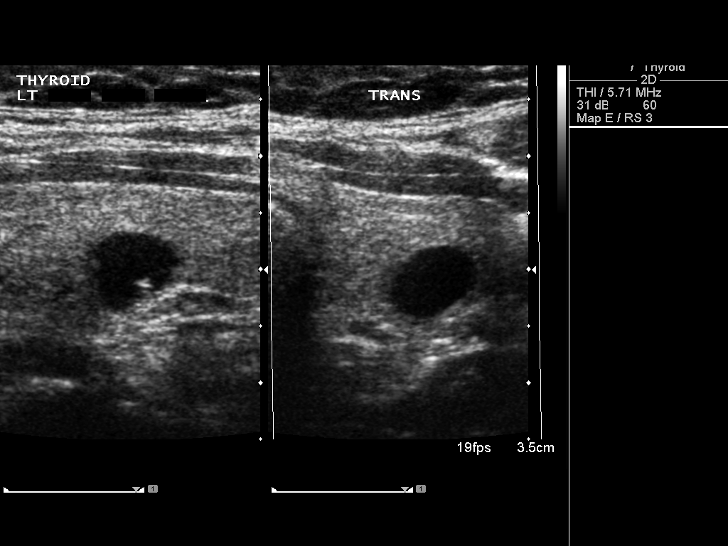
[im 36/48]
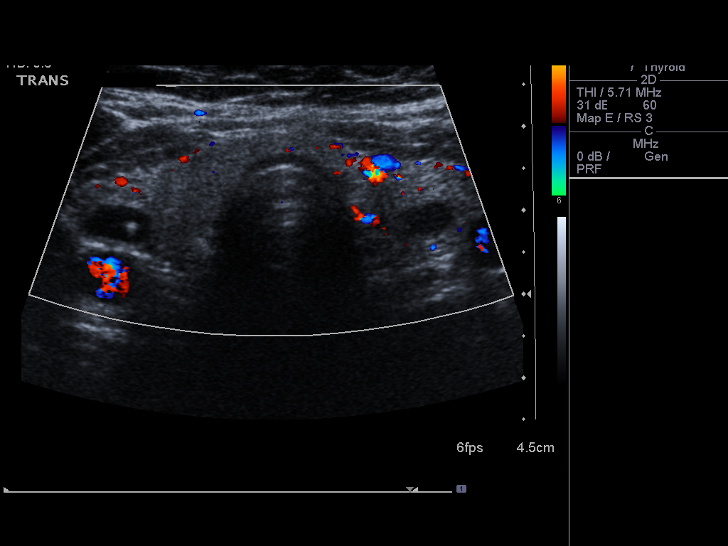
[im 40/48]
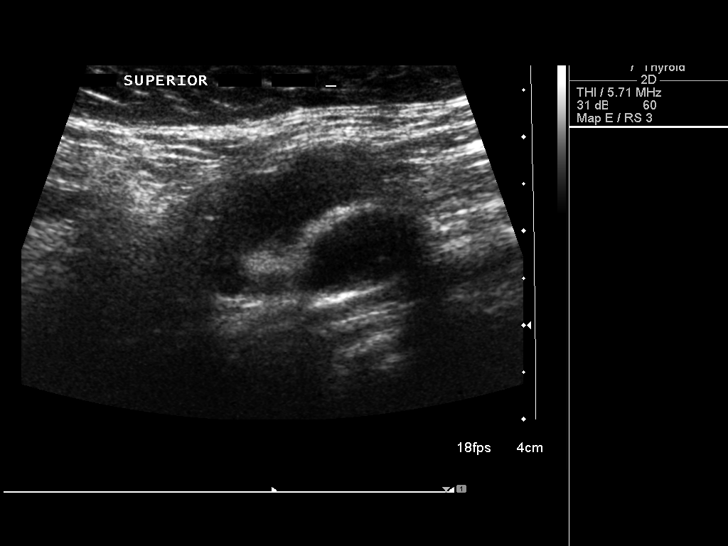
[im 44/48]
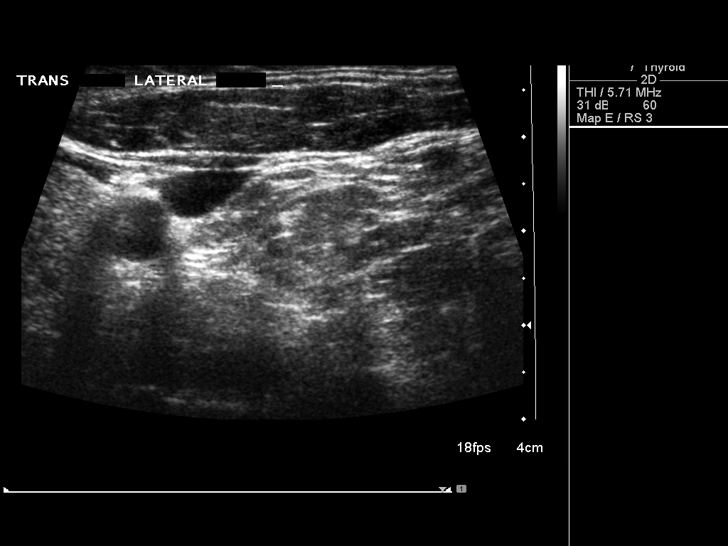
[im 48/48]
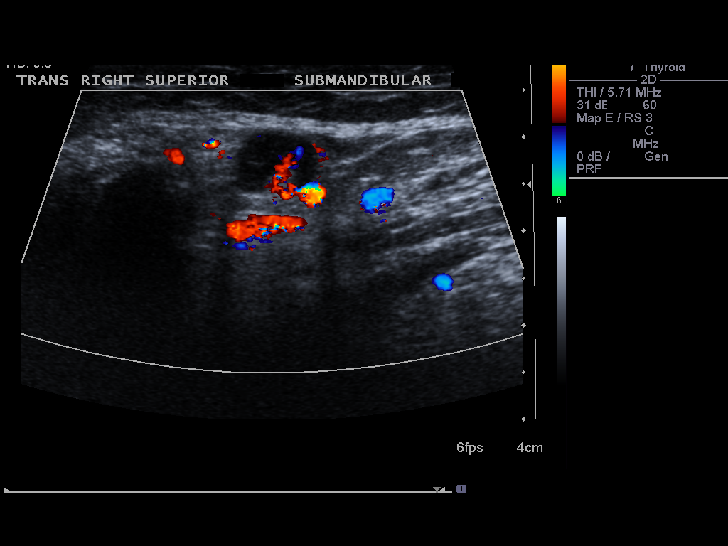

[14 of 25 positions shown; findings below may reference images not displayed]

FINDINGS: Right thyroid lobe:  Measures 5.2 x 1.9 x 1.8 cm.
Left thyroid lobe:  Measures 5.3 x 1.5 x 1.7 cm.
Isthmus:  Measures 0.2 cm.

Homogeneous thyroid echotexture.

Focal nodules:  Multiple tiny cystic lesion is noted in both lobes.
No worrisome dominant solid thyroid nodule.

Lymphadenopathy:  None visualized.
IMPRESSION: Unremarkable thyroid ultrasound examination.

## 2013-05-30 ENCOUNTER — Other Ambulatory Visit: Payer: Self-pay | Admitting: Obstetrics and Gynecology

## 2013-05-30 ENCOUNTER — Other Ambulatory Visit (HOSPITAL_COMMUNITY)
Admission: RE | Admit: 2013-05-30 | Discharge: 2013-05-30 | Disposition: A | Discharge status: Home / Self Care | Payer: Managed Care, Other (non HMO) | Source: Ambulatory Visit | Attending: Obstetrics and Gynecology | Admitting: Obstetrics and Gynecology

## 2013-05-30 DIAGNOSIS — Z1151 Encounter for screening for human papillomavirus (HPV): Secondary | ICD-10-CM | POA: Insufficient documentation

## 2013-05-30 DIAGNOSIS — Z01419 Encounter for gynecological examination (general) (routine) without abnormal findings: Secondary | ICD-10-CM | POA: Insufficient documentation

## 2014-02-04 ENCOUNTER — Encounter (HOSPITAL_COMMUNITY): Payer: Self-pay | Admitting: *Deleted

## 2014-08-07 ENCOUNTER — Other Ambulatory Visit (HOSPITAL_COMMUNITY)
Admission: RE | Admit: 2014-08-07 | Discharge: 2014-08-07 | Disposition: A | Discharge status: Home / Self Care | Payer: Managed Care, Other (non HMO) | Source: Ambulatory Visit | Attending: Obstetrics and Gynecology | Admitting: Obstetrics and Gynecology

## 2014-08-07 ENCOUNTER — Other Ambulatory Visit: Payer: Self-pay | Admitting: Obstetrics and Gynecology

## 2014-08-07 DIAGNOSIS — Z1151 Encounter for screening for human papillomavirus (HPV): Secondary | ICD-10-CM | POA: Insufficient documentation

## 2014-08-07 DIAGNOSIS — Z01419 Encounter for gynecological examination (general) (routine) without abnormal findings: Secondary | ICD-10-CM | POA: Insufficient documentation

## 2014-08-09 LAB — CYTOLOGY - PAP

## 2017-10-12 ENCOUNTER — Other Ambulatory Visit: Payer: Self-pay | Admitting: Obstetrics and Gynecology

## 2017-10-12 ENCOUNTER — Other Ambulatory Visit (HOSPITAL_COMMUNITY)
Admission: RE | Admit: 2017-10-12 | Discharge: 2017-10-12 | Disposition: A | Discharge status: Home / Self Care | Payer: BLUE CROSS/BLUE SHIELD | Source: Ambulatory Visit | Attending: Obstetrics and Gynecology | Admitting: Obstetrics and Gynecology

## 2017-10-12 DIAGNOSIS — Z01411 Encounter for gynecological examination (general) (routine) with abnormal findings: Secondary | ICD-10-CM | POA: Diagnosis not present

## 2017-10-13 LAB — CYTOLOGY - PAP
Diagnosis: NEGATIVE
HPV: NOT DETECTED

## 2018-10-18 DIAGNOSIS — Z3041 Encounter for surveillance of contraceptive pills: Secondary | ICD-10-CM | POA: Diagnosis not present

## 2018-10-18 DIAGNOSIS — Z01419 Encounter for gynecological examination (general) (routine) without abnormal findings: Secondary | ICD-10-CM | POA: Diagnosis not present

## 2018-10-18 DIAGNOSIS — F419 Anxiety disorder, unspecified: Secondary | ICD-10-CM | POA: Diagnosis not present

## 2018-11-30 DIAGNOSIS — F419 Anxiety disorder, unspecified: Secondary | ICD-10-CM | POA: Diagnosis not present

## 2018-12-19 DIAGNOSIS — F411 Generalized anxiety disorder: Secondary | ICD-10-CM | POA: Diagnosis not present

## 2018-12-28 DIAGNOSIS — F411 Generalized anxiety disorder: Secondary | ICD-10-CM | POA: Diagnosis not present

## 2019-01-10 DIAGNOSIS — F411 Generalized anxiety disorder: Secondary | ICD-10-CM | POA: Diagnosis not present

## 2019-03-12 DIAGNOSIS — F411 Generalized anxiety disorder: Secondary | ICD-10-CM | POA: Diagnosis not present

## 2019-03-31 DIAGNOSIS — J3489 Other specified disorders of nose and nasal sinuses: Secondary | ICD-10-CM | POA: Diagnosis not present

## 2019-03-31 DIAGNOSIS — Z20828 Contact with and (suspected) exposure to other viral communicable diseases: Secondary | ICD-10-CM | POA: Diagnosis not present

## 2019-04-19 DIAGNOSIS — Z20828 Contact with and (suspected) exposure to other viral communicable diseases: Secondary | ICD-10-CM | POA: Diagnosis not present

## 2019-04-19 DIAGNOSIS — R519 Headache, unspecified: Secondary | ICD-10-CM | POA: Diagnosis not present

## 2019-04-19 DIAGNOSIS — R438 Other disturbances of smell and taste: Secondary | ICD-10-CM | POA: Diagnosis not present

## 2019-04-25 DIAGNOSIS — E7800 Pure hypercholesterolemia, unspecified: Secondary | ICD-10-CM | POA: Diagnosis not present

## 2019-04-25 DIAGNOSIS — U071 COVID-19: Secondary | ICD-10-CM | POA: Diagnosis not present

## 2019-05-02 DIAGNOSIS — F411 Generalized anxiety disorder: Secondary | ICD-10-CM | POA: Diagnosis not present

## 2019-05-07 DIAGNOSIS — E7800 Pure hypercholesterolemia, unspecified: Secondary | ICD-10-CM | POA: Diagnosis not present

## 2019-05-07 DIAGNOSIS — Z6837 Body mass index (BMI) 37.0-37.9, adult: Secondary | ICD-10-CM | POA: Diagnosis not present

## 2019-05-07 DIAGNOSIS — Z5181 Encounter for therapeutic drug level monitoring: Secondary | ICD-10-CM | POA: Diagnosis not present

## 2019-08-07 DIAGNOSIS — R635 Abnormal weight gain: Secondary | ICD-10-CM | POA: Diagnosis not present

## 2019-08-07 DIAGNOSIS — R790 Abnormal level of blood mineral: Secondary | ICD-10-CM | POA: Diagnosis not present

## 2019-08-07 DIAGNOSIS — N951 Menopausal and female climacteric states: Secondary | ICD-10-CM | POA: Diagnosis not present

## 2019-08-07 DIAGNOSIS — R7303 Prediabetes: Secondary | ICD-10-CM | POA: Diagnosis not present

## 2019-08-20 DIAGNOSIS — N951 Menopausal and female climacteric states: Secondary | ICD-10-CM | POA: Diagnosis not present

## 2019-08-20 DIAGNOSIS — R635 Abnormal weight gain: Secondary | ICD-10-CM | POA: Diagnosis not present

## 2019-08-20 DIAGNOSIS — Z1339 Encounter for screening examination for other mental health and behavioral disorders: Secondary | ICD-10-CM | POA: Diagnosis not present

## 2019-08-20 DIAGNOSIS — Z6839 Body mass index (BMI) 39.0-39.9, adult: Secondary | ICD-10-CM | POA: Diagnosis not present

## 2019-08-20 DIAGNOSIS — Z1331 Encounter for screening for depression: Secondary | ICD-10-CM | POA: Diagnosis not present

## 2019-09-04 DIAGNOSIS — Z6839 Body mass index (BMI) 39.0-39.9, adult: Secondary | ICD-10-CM | POA: Diagnosis not present

## 2019-09-04 DIAGNOSIS — R7303 Prediabetes: Secondary | ICD-10-CM | POA: Diagnosis not present

## 2020-01-18 DIAGNOSIS — Z01411 Encounter for gynecological examination (general) (routine) with abnormal findings: Secondary | ICD-10-CM | POA: Diagnosis not present

## 2020-01-18 DIAGNOSIS — B372 Candidiasis of skin and nail: Secondary | ICD-10-CM | POA: Diagnosis not present

## 2020-01-18 DIAGNOSIS — R635 Abnormal weight gain: Secondary | ICD-10-CM | POA: Diagnosis not present

## 2020-01-18 DIAGNOSIS — N920 Excessive and frequent menstruation with regular cycle: Secondary | ICD-10-CM | POA: Diagnosis not present

## 2020-01-18 DIAGNOSIS — Z131 Encounter for screening for diabetes mellitus: Secondary | ICD-10-CM | POA: Diagnosis not present

## 2020-04-11 ENCOUNTER — Ambulatory Visit: Payer: BC Managed Care – PPO | Attending: Internal Medicine

## 2020-04-11 DIAGNOSIS — Z23 Encounter for immunization: Secondary | ICD-10-CM

## 2020-04-11 NOTE — Progress Notes (Signed)
   Covid-19 Vaccination Clinic  Name:  Tracey Kent    MRN: [id number] DOB: 03-10-1981  04/11/2020  [name] was observed post Covid-19 immunization for 15 minutes without incident. She was provided with Vaccine Information Sheet and instruction to access the V-Safe system.   [name] was instructed to call 911 with any severe reactions post vaccine: Marland Kitchen Difficulty breathing  . Swelling of face and throat  . A fast heartbeat  . A bad rash all over body  . Dizziness and weakness   Immunizations Administered    Name Date Dose VIS Date Route   Pfizer COVID-19 Vaccine 04/11/2020  1:53 PM 0.3 mL 01/23/2020 Intramuscular   Manufacturer: [employer], [employer]   Lot: [id number]   NDC: [address]-7

## 2020-05-13 DIAGNOSIS — L400 Psoriasis vulgaris: Secondary | ICD-10-CM | POA: Diagnosis not present

## 2020-06-12 DIAGNOSIS — L400 Psoriasis vulgaris: Secondary | ICD-10-CM | POA: Diagnosis not present

## 2020-06-30 DIAGNOSIS — J3489 Other specified disorders of nose and nasal sinuses: Secondary | ICD-10-CM | POA: Diagnosis not present

## 2020-11-10 DIAGNOSIS — L0889 Other specified local infections of the skin and subcutaneous tissue: Secondary | ICD-10-CM | POA: Diagnosis not present

## 2021-01-05 DIAGNOSIS — R051 Acute cough: Secondary | ICD-10-CM | POA: Diagnosis not present

## 2021-01-05 DIAGNOSIS — J0100 Acute maxillary sinusitis, unspecified: Secondary | ICD-10-CM | POA: Diagnosis not present

## 2021-03-19 NOTE — Progress Notes (Signed)
Tracey Kent is a 40 y.o. female here to establish care.  History of Present Illness:   Chief Complaint  Patient presents with   Establish Care   Obesity    She wants to discuss weight loss options    Hyperlipidemia   Irregular periods    She would like a referral for a new GYN    Acute Concerns: Obesity Tracey Kent expresses that she is having trouble losing weight and would like to make healthier choices since she is going into her forties. She has noticed that it is harder to lose weight the older that she gets and is interested in trialing a medication or consulting a dietician.   States she has a normal eating pattern, usually having coffee in the morning, fast food, or oatmeal depending on the days. She will order in for lunch daily while at work and cook a type of protein and vegetable for dinner. Throughout the day she drinks water or may have an occasional diet coke/alcohol beverage. Although she is aware that she should be moving more, she finds it difficult to find the time between work and home life.   Pt does have a hx of bulimia and is trying to be careful about not going down that route again. She says that she is still binging during times of stress but hasn't purged in a long time. Denies hospitalization for this issue or ever being on medication for this.   Cholesterol Levels At this time pt is compliant with lipitor 20 mg daily with no adverse effects. Although she is not having any reactions to the medication, she does feel body aches at times. States she does have a fhx of HLD but would like to work toward coming off of this medications.   Oligomenorrhea In addition to her other concerns, Pt has ntoiced that her menstrual cycles have become irregular in regards to timing and flow. For the past 3 months, Tracey Kent has noticed she is have a regular period every two weeks rather than once a month, which is her baseline. She is not sure what the change could be considering she had  her son nine years ago. During her cycles she is passing clots. Denies concerns for pregnancy or PCOS.    Hx of Anemia Pt reports she is taking an OTC MVI with iron and is managing well at this time.  Has not taken a dedicated oral iron supplement for awhile.   Psoriatic Arthritis Tracey Kent is currently compliant with taking cosentyx 300 mg monthly injection. Pt was dx with this disease earlier this year and is following up regularly with Dr. Wallace Cullens, dermatology. She is managing well.   Anxiety Pt is currently compliant with zoloft 100 mg daily and participating in talk therapy. She states she is feeling very stable despite normal anxiety that comes with the holidays. Denies SI/HI.     Health Maintenance: Immunizations -- Covid- UTD; No booster Influenza- Due  Tdap- UTD;2013 Colonoscopy -- N/A Mammogram -- N/A PAP -- Due; 2019 Bone Density -- N/A Diet -- Eats all food groups; trying to cut down fast food intake Caffeine intake -- 1 cup of cofee daily  Exercise -- Not currently Weight -- Stable; Highest recorded Wt Readings from Last 3 Encounters:  03/20/21 238 lb (108 kg)  01/31/12 229 lb (103.9 kg)    Mood -- Stable Alcohol -- 1 beverage per week Tobacco -- Former; 9 years since smoking cessation  Depression screen [facility] 2/9 03/20/2021  Decreased Interest 0  Down, Depressed, Hopeless 0  PHQ - 2 Score 0    GAD 7 : Generalized Anxiety Score 03/20/2021  Nervous, Anxious, on Edge 0  Control/stop worrying 0  Worry too much - different things 1  Trouble relaxing 1  Restless 0  Easily annoyed or irritable 1  Afraid - awful might happen 0  Total GAD 7 Score 3  Anxiety Difficulty Not difficult at all     Other providers/specialists: Patient Care Team: Jarold Motto, [place] as PCP - General (Physician Assistant)   Past Medical History:  Diagnosis Date   Anemia    Anxiety    Hyperlipidemia    Psoriasis    Psoriatic arthritis (HCC) 03/20/2021   Vaginal delivery 2013      Social History   Tobacco Use   Smoking status: Former    Packs/day: 1.00    Years: 5.00    Pack years: 5.00    Types: Cigarettes    Quit date: 05/11/2011    Years since quitting: 9.8   Smokeless tobacco: Never  Vaping Use   Vaping Use: Never used  Substance Use Topics   Alcohol use: Yes    Alcohol/week: 1.0 standard drink    Types: 1 Standard drinks or equivalent per week    Comment: Socially   Drug use: No    Past Surgical History:  Procedure Laterality Date   NO PAST SURGERIES      Family History  Problem Relation Age of Onset   Cancer Maternal Grandfather        unknown type   Diabetes Paternal Aunt    Pancreatic cancer Other     No Known Allergies   Current Medications:   Current Outpatient Medications:    atorvastatin (LIPITOR) 20 MG tablet, Take 20 mg by mouth daily., Disp: , Rfl:    COSENTYX SENSOREADY, 300 MG, 150 MG/ML SOAJ, Inject into the skin., Disp: , Rfl:    fluticasone (FLONASE) 50 MCG/ACT nasal spray, Place 2 sprays into both nostrils daily., Disp: , Rfl:    sertraline (ZOLOFT) 100 MG tablet, Take 100 mg by mouth daily., Disp: , Rfl:    Review of Systems:   ROS Negative unless otherwise specified per HPI. Vitals:   Vitals:   03/20/21 0830  BP: 128/90  Pulse: 87  Temp: (!) 97.2 F (36.2 C)  TempSrc: Temporal  SpO2: 96%  Weight: 238 lb (108 kg)  Height: 5\' 2"  (1.575 m)      Body mass index is 43.53 kg/m.  Physical Exam:   Physical Exam Vitals and nursing note reviewed.  Constitutional:      General: She is not in acute distress.    Appearance: She is well-developed. She is not ill-appearing or toxic-appearing.  Cardiovascular:     Rate and Rhythm: Normal rate and regular rhythm.     Pulses: Normal pulses.     Heart sounds: Normal heart sounds, S1 normal and S2 normal.  Pulmonary:     Effort: Pulmonary effort is normal.     Breath sounds: Normal breath sounds.  Skin:    General: Skin is warm and dry.  Neurological:      Mental Status: She is alert.     GCS: GCS eye subscore is 4. GCS verbal subscore is 5. GCS motor subscore is 6.  Psychiatric:        Speech: Speech normal.        Behavior: Behavior normal. Behavior is cooperative.    Assessment and Plan:   Hyperlipidemia,  unspecified hyperlipidemia type Update labs today, will adjust lipitor 20 mg as indicated  Anemia, unspecified type Will update IBC + Ferritin and provide recommendations accordingly   Anxiety Stable Continue Zoloft 100 mg daily and talk therapy Patient denies SI/HI at today's visit I advised patient that if they develop any SI, to tell someone and seek medical attention immediately  Psoriatic arthritis (HCC); Psoriasis Maintain Cosentyx 300 mg monthly injection -- managed by Dr. Wallace Cullens, Dermatology   Obesity, unspecified classification, unspecified obesity type, unspecified whether serious comorbidity present Update labs today Will send referral to dietician  Provided regular exercise recommendations such as walking 30 min per week Follow up after consulting with dietician for consideration of medication -- consider Wellbutrin or Vyvanse  Irregular periods Referral to Gynecology has been placed  Follow up as needed Continue to monitor for now   I,Tracey Kent,acting as a scribe for Jarold Motto, PA.,have documented all relevant documentation on the behalf of Jarold Motto, PA,as directed by  Jarold Motto, PA while in the presence of Jarold Motto, [place].  I, Jarold Motto, [place], have reviewed all documentation for this visit. The documentation on 03/20/21 for the exam, diagnosis, procedures, and orders are all accurate and complete.   Jarold Motto, PA-C

## 2021-03-20 ENCOUNTER — Encounter: Payer: Self-pay | Admitting: Physician Assistant

## 2021-03-20 ENCOUNTER — Ambulatory Visit (INDEPENDENT_AMBULATORY_CARE_PROVIDER_SITE_OTHER): Payer: BC Managed Care – PPO | Admitting: Physician Assistant

## 2021-03-20 ENCOUNTER — Other Ambulatory Visit: Payer: Self-pay

## 2021-03-20 VITALS — BP 128/90 | HR 87 | Temp 97.2°F | Ht 62.0 in | Wt 238.0 lb

## 2021-03-20 DIAGNOSIS — F419 Anxiety disorder, unspecified: Secondary | ICD-10-CM

## 2021-03-20 DIAGNOSIS — D649 Anemia, unspecified: Secondary | ICD-10-CM

## 2021-03-20 DIAGNOSIS — N926 Irregular menstruation, unspecified: Secondary | ICD-10-CM | POA: Diagnosis not present

## 2021-03-20 DIAGNOSIS — E785 Hyperlipidemia, unspecified: Secondary | ICD-10-CM | POA: Diagnosis not present

## 2021-03-20 DIAGNOSIS — E669 Obesity, unspecified: Secondary | ICD-10-CM | POA: Diagnosis not present

## 2021-03-20 DIAGNOSIS — L409 Psoriasis, unspecified: Secondary | ICD-10-CM

## 2021-03-20 DIAGNOSIS — L4050 Arthropathic psoriasis, unspecified: Secondary | ICD-10-CM

## 2021-03-20 HISTORY — DX: Arthropathic psoriasis, unspecified: L40.50

## 2021-03-20 LAB — COMPREHENSIVE METABOLIC PANEL
ALT: 13 U/L (ref 0–35)
AST: 11 U/L (ref 0–37)
Albumin: 3.9 g/dL (ref 3.5–5.2)
Alkaline Phosphatase: 92 U/L (ref 39–117)
BUN: 12 mg/dL (ref 6–23)
CO2: 31 mEq/L (ref 19–32)
Calcium: 9.5 mg/dL (ref 8.4–10.5)
Chloride: 101 mEq/L (ref 96–112)
Creatinine, Ser: 0.57 mg/dL (ref 0.40–1.20)
GFR: 114.52 mL/min (ref >60.00)
Glucose, Bld: 91 mg/dL (ref 70–99)
Potassium: 4.8 mEq/L (ref 3.5–5.1)
Sodium: 139 mEq/L (ref 135–145)
Total Bilirubin: 0.3 mg/dL (ref 0.2–1.2)
Total Protein: 7 g/dL (ref 6.0–8.3)

## 2021-03-20 LAB — CBC WITH DIFFERENTIAL/PLATELET
Basophils Absolute: 0.1 10*3/uL (ref 0.0–0.1)
Basophils Relative: 0.7 % (ref 0.0–3.0)
Eosinophils Absolute: 0.2 10*3/uL (ref 0.0–0.7)
Eosinophils Relative: 2.6 % (ref 0.0–5.0)
HCT: 37 % (ref 36.0–46.0)
Hemoglobin: 11.8 g/dL — ABNORMAL LOW (ref 12.0–15.0)
Lymphocytes Relative: 25.7 % (ref 12.0–46.0)
Lymphs Abs: 2 10*3/uL (ref 0.7–4.0)
MCHC: 31.8 g/dL (ref 30.0–36.0)
MCV: 83.2 fl (ref 78.0–100.0)
Monocytes Absolute: 0.5 10*3/uL (ref 0.1–1.0)
Monocytes Relative: 7.1 % (ref 3.0–12.0)
Neutro Abs: 4.9 10*3/uL (ref 1.4–7.7)
Neutrophils Relative %: 63.9 % (ref 43.0–77.0)
Platelets: 415 10*3/uL — ABNORMAL HIGH (ref 150.0–400.0)
RBC: 4.45 Mil/uL (ref 3.87–5.11)
RDW: 14.7 % (ref 11.5–15.5)
WBC: 7.7 10*3/uL (ref 4.0–10.5)

## 2021-03-20 LAB — TSH: TSH: 0.67 u[IU]/mL (ref 0.35–5.50)

## 2021-03-20 LAB — LIPID PANEL
Cholesterol: 205 mg/dL — ABNORMAL HIGH (ref 0–200)
HDL: 57.3 mg/dL (ref >39.00)
LDL Cholesterol: 109 mg/dL — ABNORMAL HIGH (ref 0–99)
NonHDL: 148.08
Total CHOL/HDL Ratio: 4
Triglycerides: 193 mg/dL — ABNORMAL HIGH (ref 0.0–149.0)
VLDL: 38.6 mg/dL (ref 0.0–40.0)

## 2021-03-20 LAB — HEMOGLOBIN A1C: Hgb A1c MFr Bld: 6 % (ref 4.6–6.5)

## 2021-03-20 LAB — IBC + FERRITIN
Ferritin: 17.4 ng/mL (ref 10.0–291.0)
Iron: 47 ug/dL (ref 42–145)
Saturation Ratios: 11.1 % — ABNORMAL LOW (ref 20.0–50.0)
TIBC: 424.2 ug/dL (ref 250.0–450.0)
Transferrin: 303 mg/dL (ref 212.0–360.0)

## 2021-03-20 LAB — T4, FREE: Free T4: 0.82 ng/dL (ref 0.60–1.60)

## 2021-03-20 NOTE — Patient Instructions (Signed)
It was great to see you!  -Gynecology referral has been placed today -We will update your blood work today -You will be contacted about your referral to a dietitian -Let's follow-up after you meet with the dietitian to consider medication -Please start walking for 30 min per week  Consider Vyvanse or possibly add Wellbutrin to your current zoloft regimen.  Take care,  Jarold Motto PA-C

## 2021-04-09 ENCOUNTER — Encounter: Payer: Self-pay | Admitting: Physician Assistant

## 2021-04-10 ENCOUNTER — Other Ambulatory Visit: Payer: Self-pay | Admitting: Physician Assistant

## 2021-04-10 MED ORDER — OZEMPIC (0.25 OR 0.5 MG/DOSE) 2 MG/1.5ML ~~LOC~~ SOPN: 0.2500 mg | PEN_INJECTOR | SUBCUTANEOUS | 0 refills | Status: DC

## 2021-04-10 MED ORDER — ATORVASTATIN CALCIUM 40 MG PO TABS: 40.0000 mg | ORAL_TABLET | Freq: Every day | ORAL | 3 refills | Status: DC

## 2021-04-27 ENCOUNTER — Other Ambulatory Visit: Payer: Self-pay | Admitting: Physician Assistant

## 2021-04-27 MED ORDER — OZEMPIC (0.25 OR 0.5 MG/DOSE) 2 MG/1.5ML ~~LOC~~ SOPN: 0.5000 mg | PEN_INJECTOR | SUBCUTANEOUS | 0 refills | Status: DC

## 2021-05-04 ENCOUNTER — Other Ambulatory Visit: Payer: Self-pay | Admitting: Physician Assistant

## 2021-05-04 ENCOUNTER — Ambulatory Visit: Payer: BC Managed Care – PPO | Admitting: Physician Assistant

## 2021-05-05 ENCOUNTER — Other Ambulatory Visit: Payer: Self-pay | Admitting: Physician Assistant

## 2021-05-05 MED ORDER — WEGOVY 0.5 MG/0.5ML ~~LOC~~ SOAJ: 0.5000 mg | SUBCUTANEOUS | 0 refills | Status: DC

## 2021-05-05 NOTE — Telephone Encounter (Signed)
Ozempic on back order, I've sent in [facility]

## 2021-05-06 NOTE — Telephone Encounter (Signed)
Okay to fill Zoloft 100 mg daily?

## 2021-05-07 MED ORDER — SERTRALINE HCL 100 MG PO TABS: 100.0000 mg | ORAL_TABLET | Freq: Every day | ORAL | 2 refills | Status: DC

## 2021-05-07 MED ORDER — SERTRALINE HCL 100 MG PO TABS: 100.0000 mg | ORAL_TABLET | Freq: Every day | ORAL | 1 refills | Status: DC

## 2021-05-11 NOTE — Telephone Encounter (Signed)
Pt called back told her per Lelon Mast,  I understand that she has been on this for one month but if she has only been on this for one month then I suspect that she is still on the 0.25 mg dosage? Pt said yes. I understand that there is a shortage of the lower dose but I'm not comfortable prescribing a higher dose and skipping the 0.5 mg dosage.  Ozempic FDA approved regimen:  Week 1 through week 4 : 0.25 mg once weekly.  Week 5 through week 8: 0.5 mg once weekly.  Week 9 through week 12: 1 mg once weekly.  If we increase more quickly than this, she may have significant stomach upset or other concerns. I'm not comfortable increasing more quickly than this. Pt verbalized understanding. Told pt to try one of the [facility] pharmacies like [facility] have been able to get it through them. Let me know and I will send Rx over. Pt verbalized understanding.

## 2021-05-11 NOTE — Telephone Encounter (Signed)
Left message on voicemail to call office.  

## 2021-05-14 ENCOUNTER — Other Ambulatory Visit (HOSPITAL_COMMUNITY): Payer: Self-pay

## 2021-05-14 MED ORDER — OZEMPIC (0.25 OR 0.5 MG/DOSE) 2 MG/1.5ML ~~LOC~~ SOPN
0.5000 mg | PEN_INJECTOR | SUBCUTANEOUS | 0 refills | Status: DC
  Filled 2021-05-14 – 2021-05-23 (×2): qty 1.5, 28d supply, fill #0

## 2021-05-14 NOTE — Addendum Note (Signed)
Addended by: Jimmye Norman on: 05/14/2021 09:02 AM   Modules accepted: Orders

## 2021-05-18 DIAGNOSIS — L400 Psoriasis vulgaris: Secondary | ICD-10-CM | POA: Diagnosis not present

## 2021-05-23 ENCOUNTER — Other Ambulatory Visit (HOSPITAL_COMMUNITY): Payer: Self-pay

## 2021-06-02 ENCOUNTER — Encounter: Payer: Self-pay | Admitting: Registered"

## 2021-06-02 ENCOUNTER — Other Ambulatory Visit: Payer: Self-pay

## 2021-06-02 ENCOUNTER — Encounter: Payer: BC Managed Care – PPO | Attending: Physician Assistant | Admitting: Registered"

## 2021-06-02 DIAGNOSIS — E669 Obesity, unspecified: Secondary | ICD-10-CM | POA: Insufficient documentation

## 2021-06-02 DIAGNOSIS — Z713 Dietary counseling and surveillance: Secondary | ICD-10-CM | POA: Insufficient documentation

## 2021-06-02 NOTE — Patient Instructions (Signed)
-   Increase fiber intake with whole grains, fruits, vegetables, nuts, and plant-based items. See handout.   - Continue with at least 3 days of movement a week.   - Aim for 7 hours of sleep nightly.    - Aim for vegetables with lunch and dinner.

## 2021-06-02 NOTE — Progress Notes (Signed)
Medical Nutrition Therapy  Appointment Start time:  8:08  Appointment End time:  9:12  Primary concerns today: wants a plan of what she needs to do moving forward  Referral diagnosis: obesity Preferred learning style: no preference indicated Learning readiness: ready, change in progress   NUTRITION ASSESSMENT   Pt arrives stating she has history of eating disorder that started during teenage years. States she Did not receive treatment. States she had history of anorexia and bulimia. States she wants to move forward and to make sure she is learning healthy choices related to food along the way. Reports she also wants to positively influence son.   States she sees a therapist periodically as needed. States she has not seen them in a while; using coping skills from session to help cope with stress.   States she is trying to increase activity by walking in neighborhood sometimes. States she and family go to beach periodically and will walk the beach while there.   Pt expectations: plan of what she needs to do moving forward, information   Clinical Medical Hx: anemia Medications: See list Labs: elevated A1c (6.0), elevated Chol (205), elevated LDL (109), elevated Trg (193) Notable Signs/Symptoms: none reported  Lifestyle & Dietary Hx  Estimated daily fluid intake: ~60 oz Supplements: See list Sleep: 6-8 hrs/night Stress / self-care: aggressive cleaning  Current average weekly physical activity: yard work 1-3 hrs, 3x/week, walking sometimes  24-Hr Dietary Recall First Meal: [employer]-chicken burrito + hash browns + diet Coke Snack:  Second Meal: [employer]-steak and cheese sandwich + apple + water Snack:  Third Meal: salad (vegetables, feta, vinaigrette) + water Snack:  Beverages: water (1.5*40 oz; 60 oz), diet Coke    NUTRITION DIAGNOSIS  NB-1.1 Food and nutrition-related knowledge deficit As related to elevated lipid panel and A1C.  As evidenced by lack of previous  nutrition-related education.   NUTRITION INTERVENTION  Nutrition education (E-1) on the following topics: Nutrition education and counseling. Pt was educated and counseled on high cholesterol, nutritional ways to lower cholesterol, ways to increase fiber intake, and ways to increase physical activity. Pt was encouraged to continue having 3 meals/day and staying hydrated. Pt agreed with goals listed.   Handouts Provided Include  LDL Cholesterol-Lowering Nutrition Therapy  Learning Style & Readiness for Change Teaching method utilized: Visual & Auditory  Demonstrated degree of understanding via: Teach Back  Barriers to learning/adherence to lifestyle change: none identified  Goals Established by Pt Increase fiber intake with whole grains, fruits, vegetables, nuts, and plant-based items. See handout.  Continue with at least 3 days of movement a week.  Aim for 7 hours of sleep nightly.  Aim for vegetables with lunch and dinner.    MONITORING & EVALUATION Dietary intake, weekly physical activity.  Next Steps  Patient is to follow-up in 2 months.

## 2021-06-25 ENCOUNTER — Encounter: Payer: Self-pay | Admitting: Physician Assistant

## 2021-06-26 ENCOUNTER — Other Ambulatory Visit: Payer: Self-pay | Admitting: Physician Assistant

## 2021-06-26 MED ORDER — SEMAGLUTIDE (1 MG/DOSE) 4 MG/3ML ~~LOC~~ SOPN: 1.0000 mg | PEN_INJECTOR | SUBCUTANEOUS | 0 refills | Status: DC

## 2021-07-14 ENCOUNTER — Telehealth (INDEPENDENT_AMBULATORY_CARE_PROVIDER_SITE_OTHER): Payer: BC Managed Care – PPO | Admitting: Physician Assistant

## 2021-07-14 ENCOUNTER — Encounter: Payer: Self-pay | Admitting: Physician Assistant

## 2021-07-14 VITALS — Ht 62.0 in | Wt 227.0 lb

## 2021-07-14 DIAGNOSIS — E669 Obesity, unspecified: Secondary | ICD-10-CM

## 2021-07-14 DIAGNOSIS — F5081 Binge eating disorder: Secondary | ICD-10-CM

## 2021-07-14 DIAGNOSIS — F419 Anxiety disorder, unspecified: Secondary | ICD-10-CM

## 2021-07-14 MED ORDER — LISDEXAMFETAMINE DIMESYLATE 30 MG PO CAPS: 30.0000 mg | ORAL_CAPSULE | Freq: Every day | ORAL | 0 refills | Status: DC

## 2021-07-14 MED ORDER — SEMAGLUTIDE (2 MG/DOSE) 8 MG/3ML ~~LOC~~ SOPN: 2.0000 mg | PEN_INJECTOR | SUBCUTANEOUS | 1 refills | Status: DC

## 2021-07-14 NOTE — Progress Notes (Signed)
? ?  Virtual Visit via Video Note  ? ?I, , connected with  Camron Biever  ([id number], 02/26/1981) on 07/14/21 at  2:40 PM EDT by a video-enabled telemedicine application and verified that I am speaking with the correct person using two identifiers. ? ?Location: ?Patient: Home ?Provider: [facility] [place] office ?  ?I discussed the limitations of evaluation and management by telemedicine and the availability of in person appointments. The patient expressed understanding and agreed to proceed.   ? ?History of Present Illness: ?Tracey Kent is a 41 y.o. who identifies as a female who was assigned female at birth, and is being seen today for binge eating. Pt would like to discuss starting Vyvanse. ? ?She is currently down 11 lb since last seeing me. She has started walking 1-2 times per week for about 30 minutes. She has seen an RD since last seeing me. She is having increased stress and feels that she is gravitating towards more binge eating habits. Denies any purging. ? ?Currently on Ozempic 1 mg and tolerating well. Has not had any issues with dose increases. Currently on zoloft 100 mg and overall mood is stable. Denies SI/HI. ? ?Problems:  ?Patient Active Problem List  ? Diagnosis Date Noted  ? Anemia 03/20/2021  ? Anxiety 03/20/2021  ? Hyperlipidemia 03/20/2021  ? Psoriasis 03/20/2021  ? Psoriatic arthritis (HCC) 03/20/2021  ?  ?Allergies: No Known Allergies ?Medications:  ?Current Outpatient Medications:  ?  atorvastatin (LIPITOR) 40 MG tablet, Take 1 tablet (40 mg total) by mouth daily., Disp: 90 tablet, Rfl: 3 ?  lisdexamfetamine (VYVANSE) 30 MG capsule, Take 1 capsule (30 mg total) by mouth daily., Disp: 30 capsule, Rfl: 0 ?  Semaglutide, 2 MG/DOSE, 8 MG/3ML SOPN, Inject 2 mg as directed once a week., Disp: 3 mL, Rfl: 1 ?  sertraline (ZOLOFT) 100 MG tablet, Take 1 tablet (100 mg total) by mouth daily., Disp: 90 tablet, Rfl: 1 ?  SKYRIZI PEN 150 MG/ML SOAJ, Inject into the skin., Disp: , Rfl:   ? ?Observations/Objective: ?Patient is well-developed, well-nourished in no acute distress.  ?Resting comfortably  at home.  ?Head is normocephalic, atraumatic.  ?No labored breathing.  ?Speech is clear and coherent with logical content.  ?Patient is alert and oriented at baseline.  ? ?Assessment and Plan: ?1. Anxiety ?Well controlled ?Continue zoloft 100 mg daily ?I have asked her to let us know if her anxiety becomes less controlled with start of vyvanse ?Follow-up in 3 months, sooner if concerns ? ?2. Binge eating disorder ?Uncontrolled ?Start vyvanse 30 mg daily ?Follow-up in 3 months ?Side effects, risks/benefits discussed ? ?3. Obesity, unspecified classification, unspecified obesity type, unspecified whether serious comorbidity present ?Improving ?Increase Ozempic to 2 mg weekly when due for next dose increase -- this was sent in today ?Follow-up in 3 months, sooner if concerns ? ?Follow Up Instructions: ?I discussed the assessment and treatment plan with the patient. The patient was provided an opportunity to ask questions and all were answered. The patient agreed with the plan and demonstrated an understanding of the instructions.  A copy of instructions were sent to the patient via MyChart unless otherwise noted below.  ? ?The patient was advised to call back or seek an in-person evaluation if the symptoms worsen or if the condition fails to improve as anticipated. ? ?Jarold Motto, PA ?

## 2021-07-15 ENCOUNTER — Telehealth: Payer: Self-pay | Admitting: *Deleted

## 2021-07-15 NOTE — Telephone Encounter (Signed)
Request Reference Number: [id number]. OZEMPIC INJ 8MG /3ML is denied due to Plan Exclusion ?Patient notified, advise  to call insurance for coverage  ?

## 2021-07-15 NOTE — Telephone Encounter (Signed)
PA (Key: [id number]) ?Rx #: [id number] ?Ozempic (2 MG/DOSE) 8MG /3ML pen-injectors ?Waiting for determination  ?

## 2021-07-16 NOTE — Telephone Encounter (Signed)
Notice of Denial printed and placed to be scan on patient chart  ?

## 2021-07-22 ENCOUNTER — Ambulatory Visit: Payer: BC Managed Care – PPO | Admitting: Registered"

## 2021-08-17 ENCOUNTER — Other Ambulatory Visit: Payer: Self-pay | Admitting: Physician Assistant

## 2021-08-18 ENCOUNTER — Other Ambulatory Visit: Payer: Self-pay | Admitting: Physician Assistant

## 2021-08-19 NOTE — Telephone Encounter (Signed)
Pt requesting refill for Vyvanse 30 mg. Last OV 07/14/2021. ?

## 2021-08-20 MED ORDER — LISDEXAMFETAMINE DIMESYLATE 30 MG PO CAPS: 30.0000 mg | ORAL_CAPSULE | Freq: Every day | ORAL | 0 refills | Status: DC

## 2021-10-01 ENCOUNTER — Other Ambulatory Visit: Payer: Self-pay | Admitting: Physician Assistant

## 2021-10-01 MED ORDER — LISDEXAMFETAMINE DIMESYLATE 30 MG PO CAPS: 30.0000 mg | ORAL_CAPSULE | Freq: Every day | ORAL | 0 refills | Status: DC

## 2021-10-07 ENCOUNTER — Other Ambulatory Visit (HOSPITAL_COMMUNITY): Payer: Self-pay

## 2021-11-14 ENCOUNTER — Other Ambulatory Visit: Payer: Self-pay | Admitting: Physician Assistant

## 2021-12-10 ENCOUNTER — Other Ambulatory Visit: Payer: Self-pay | Admitting: Physician Assistant

## 2021-12-28 ENCOUNTER — Encounter: Payer: Self-pay | Admitting: *Deleted

## 2022-02-03 ENCOUNTER — Other Ambulatory Visit: Payer: Self-pay | Admitting: Physician Assistant

## 2022-02-21 ENCOUNTER — Other Ambulatory Visit: Payer: Self-pay | Admitting: Physician Assistant

## 2022-02-23 DIAGNOSIS — R0981 Nasal congestion: Secondary | ICD-10-CM | POA: Diagnosis not present

## 2022-02-23 DIAGNOSIS — R051 Acute cough: Secondary | ICD-10-CM | POA: Diagnosis not present

## 2022-02-23 DIAGNOSIS — J069 Acute upper respiratory infection, unspecified: Secondary | ICD-10-CM | POA: Diagnosis not present

## 2022-02-23 DIAGNOSIS — R509 Fever, unspecified: Secondary | ICD-10-CM | POA: Diagnosis not present

## 2022-03-18 ENCOUNTER — Encounter: Payer: Self-pay | Admitting: *Deleted

## 2022-05-12 ENCOUNTER — Other Ambulatory Visit: Payer: Self-pay | Admitting: Physician Assistant

## 2022-08-19 ENCOUNTER — Other Ambulatory Visit: Payer: Self-pay | Admitting: Physician Assistant

## 2022-10-04 DIAGNOSIS — H1045 Other chronic allergic conjunctivitis: Secondary | ICD-10-CM | POA: Diagnosis not present

## 2022-10-28 DIAGNOSIS — Z1151 Encounter for screening for human papillomavirus (HPV): Secondary | ICD-10-CM | POA: Diagnosis not present

## 2022-10-28 DIAGNOSIS — Z01419 Encounter for gynecological examination (general) (routine) without abnormal findings: Secondary | ICD-10-CM | POA: Diagnosis not present

## 2022-10-28 DIAGNOSIS — Z6841 Body Mass Index (BMI) 40.0 and over, adult: Secondary | ICD-10-CM | POA: Diagnosis not present

## 2022-10-28 DIAGNOSIS — Z1321 Encounter for screening for nutritional disorder: Secondary | ICD-10-CM | POA: Diagnosis not present

## 2022-10-28 DIAGNOSIS — Z124 Encounter for screening for malignant neoplasm of cervix: Secondary | ICD-10-CM | POA: Diagnosis not present

## 2022-10-28 DIAGNOSIS — R635 Abnormal weight gain: Secondary | ICD-10-CM | POA: Diagnosis not present

## 2022-10-29 LAB — HM PAP SMEAR: HPV, high-risk: NEGATIVE

## 2022-11-24 ENCOUNTER — Other Ambulatory Visit (HOSPITAL_COMMUNITY): Payer: Self-pay

## 2022-11-24 DIAGNOSIS — E1165 Type 2 diabetes mellitus with hyperglycemia: Secondary | ICD-10-CM | POA: Diagnosis not present

## 2022-11-24 MED ORDER — MOUNJARO 2.5 MG/0.5ML ~~LOC~~ SOAJ
2.5000 mg | SUBCUTANEOUS | 1 refills | Status: DC
Start: 2022-11-24 — End: 2023-01-11
  Filled 2022-11-24 – 2022-12-13 (×2): qty 2, 28d supply, fill #0

## 2022-12-08 ENCOUNTER — Other Ambulatory Visit (HOSPITAL_COMMUNITY): Payer: Self-pay

## 2022-12-13 ENCOUNTER — Other Ambulatory Visit (HOSPITAL_COMMUNITY): Payer: Self-pay

## 2022-12-28 ENCOUNTER — Other Ambulatory Visit: Payer: Self-pay | Admitting: Physician Assistant

## 2022-12-29 DIAGNOSIS — L821 Other seborrheic keratosis: Secondary | ICD-10-CM | POA: Diagnosis not present

## 2023-01-11 ENCOUNTER — Encounter: Payer: Self-pay | Admitting: Physician Assistant

## 2023-01-11 ENCOUNTER — Other Ambulatory Visit (HOSPITAL_COMMUNITY): Payer: Self-pay

## 2023-01-11 ENCOUNTER — Ambulatory Visit (INDEPENDENT_AMBULATORY_CARE_PROVIDER_SITE_OTHER): Payer: BC Managed Care – PPO | Admitting: Physician Assistant

## 2023-01-11 VITALS — BP 124/80 | HR 97 | Temp 97.8°F | Ht 62.0 in | Wt 255.5 lb

## 2023-01-11 DIAGNOSIS — R7309 Other abnormal glucose: Secondary | ICD-10-CM

## 2023-01-11 DIAGNOSIS — E785 Hyperlipidemia, unspecified: Secondary | ICD-10-CM

## 2023-01-11 DIAGNOSIS — Z23 Encounter for immunization: Secondary | ICD-10-CM

## 2023-01-11 DIAGNOSIS — F5081 Binge eating disorder, mild: Secondary | ICD-10-CM

## 2023-01-11 DIAGNOSIS — F419 Anxiety disorder, unspecified: Secondary | ICD-10-CM

## 2023-01-11 MED ORDER — ATORVASTATIN CALCIUM 80 MG PO TABS
80.0000 mg | ORAL_TABLET | Freq: Every day | ORAL | 3 refills | Status: DC
  Filled 2023-01-11: qty 90, 90d supply, fill #0
  Filled 2023-05-27: qty 90, 90d supply, fill #1
  Filled 2023-09-21: qty 90, 90d supply, fill #2
  Filled 2023-12-21: qty 90, 90d supply, fill #3

## 2023-01-11 MED ORDER — LISDEXAMFETAMINE DIMESYLATE 30 MG PO CAPS
30.0000 mg | ORAL_CAPSULE | Freq: Every day | ORAL | 0 refills | Status: DC
Start: 2023-02-10 — End: 2023-07-26
  Filled 2023-05-27: qty 30, 30d supply, fill #0

## 2023-01-11 MED ORDER — LISDEXAMFETAMINE DIMESYLATE 30 MG PO CAPS
30.0000 mg | ORAL_CAPSULE | Freq: Every day | ORAL | 0 refills | Status: DC
Start: 2023-01-11 — End: 2023-07-26
  Filled 2023-01-11 – 2023-04-18 (×4): qty 30, 30d supply, fill #0

## 2023-01-11 MED ORDER — LISDEXAMFETAMINE DIMESYLATE 30 MG PO CAPS
30.0000 mg | ORAL_CAPSULE | Freq: Every day | ORAL | 0 refills | Status: DC
Start: 2023-03-12 — End: 2023-07-26

## 2023-01-11 NOTE — Progress Notes (Signed)
Tracey Kent is a 42 y.o. female here for a follow-up of a pre-existing problem.Marland Kitchen History of Present Illness:   Chief Complaint  Patient presents with   Anxiety    Pt would like to discuss decreasing Zoloft dose and starting on Vyvanse for focusing and binge eating.   Medication Refill    Atorvastatin 40 mg    HPI  Anxiety; Binge- Eating Disorder Managed with 100 mg Zoloft for a couple of years, increased from 50 mg prior.  Expresses that she'd like to reduce this medication with the goal of weaning. She denies concerning side effect(s) from medication. Notes that Vyvanse helped her attention and binge eating more and feels that this may be all she needs to manage her anxiety.  Hyperlipidemia: Managed/Compliant with 40 mg Atorvastatin. Tolerating well.  Denies leg or muscle pain.   Most recent LDL is 131 in July 2024 (seen in [facility])  Elevated Glucose: Noted on her recent visit 7/25 to [facility] [place]. Started 500 mg Metformin daily. Tolerating well. A1c of 6.7%  Lab Results  Component Value Date   HGBA1C 6.0 03/20/2021   Past Medical History:  Diagnosis Date   Anemia    Anxiety    Hyperlipidemia    Psoriasis    Psoriatic arthritis (HCC) 03/20/2021   Vaginal delivery 2013    Social History   Tobacco Use   Smoking status: Former    Current packs/day: 0.00    Average packs/day: 1 pack/day for 5.0 years (5.0 ttl pk-yrs)    Types: Cigarettes    Start date: 05/10/2006    Quit date: 05/11/2011    Years since quitting: 11.6   Smokeless tobacco: Never  Vaping Use   Vaping status: Never Used  Substance Use Topics   Alcohol use: Yes    Alcohol/week: 1.0 standard drink of alcohol    Types: 1 Standard drinks or equivalent per week    Comment: Socially   Drug use: No   Past Surgical History:  Procedure Laterality Date   NO PAST SURGERIES     Family History  Problem Relation Age of Onset   Cancer Maternal Grandfather        unknown type   Diabetes Paternal  Aunt    Pancreatic cancer Other    No Known Allergies Current Medications:   Current Outpatient Medications:    atorvastatin (LIPITOR) 80 MG tablet, Take 1 tablet (80 mg total) by mouth daily., Disp: 90 tablet, Rfl: 3   Blood Glucose Monitoring Suppl (ACCU-CHEK GUIDE) w/Device KIT, USE AS DIRECTED TO MONITOR BLOOD GLUCOSE LEVELES, Disp: , Rfl:    lisdexamfetamine (VYVANSE) 30 MG capsule, Take 1 capsule (30 mg total) by mouth daily before breakfast., Disp: 30 capsule, Rfl: 0   [START ON 02/10/2023] lisdexamfetamine (VYVANSE) 30 MG capsule, Take 1 capsule (30 mg total) by mouth daily before breakfast., Disp: 30 capsule, Rfl: 0   [START ON 03/12/2023] lisdexamfetamine (VYVANSE) 30 MG capsule, Take 1 capsule (30 mg total) by mouth daily before breakfast., Disp: 30 capsule, Rfl: 0   metFORMIN (GLUCOPHAGE-XR) 500 MG 24 hr tablet, Take 500 mg by mouth daily with breakfast., Disp: , Rfl:    sertraline (ZOLOFT) 100 MG tablet, TAKE 1 TABLET BY MOUTH EVERY DAY, Disp: 90 tablet, Rfl: 1  Review of Systems:   ROS See pertinent positives and negatives as per the HPI.  Vitals:   Vitals:   01/11/23 1404  BP: 124/80  Pulse: 97  Temp: 97.8 F (36.6 C)  TempSrc:  Temporal  SpO2: 95%  Weight: 255 lb 8 oz (115.9 kg)  Height: 5\' 2"  (1.575 m)     Body mass index is 46.73 kg/m.  Physical Exam:   Physical Exam Vitals and nursing note reviewed.  Constitutional:      General: She is not in acute distress.    Appearance: She is well-developed. She is not ill-appearing or toxic-appearing.  Cardiovascular:     Rate and Rhythm: Normal rate and regular rhythm.     Pulses: Normal pulses.     Heart sounds: Normal heart sounds, S1 normal and S2 normal.  Pulmonary:     Effort: Pulmonary effort is normal.     Breath sounds: Normal breath sounds.  Skin:    General: Skin is warm and dry.  Neurological:     Mental Status: She is alert.     GCS: GCS eye subscore is 4. GCS verbal subscore is 5. GCS motor  subscore is 6.  Psychiatric:        Speech: Speech normal.        Behavior: Behavior normal. Behavior is cooperative.     Assessment and Plan:   Anxiety Well controlled Decrease Zoloft to 50 mg daily Follow-up in 3 month(s), sooner if concerns  Mild binge-eating disorder Restart Vyvanse 30 mg daily Follow-up in 3 months, sooner if concerns  Hyperlipidemia, unspecified hyperlipidemia type LDL not at goal Increase Lipitor to 80 mg daily Follow-up in 3 months, sooner if concerns  Elevated glucose Continue metformin 500 mg daily Will trial GLP-1 when insurance allows (likely at the new year) Follow-up in 3 month(s), sooner if concerns  Need for prophylactic vaccination with combined diphtheria-tetanus-pertussis (DTP) vaccine Completed  Need for immunization against influenza Completed'   I,Emily Lagle,acting as a Neurosurgeon for [employer], PA.,have documented all relevant documentation on the behalf of Jarold Motto, PA,as directed by  Jarold Motto, PA while in the presence of Jarold Motto, [place].  I, Jarold Motto, [place], have reviewed all documentation for this visit. The documentation on 01/11/23 for the exam, diagnosis, procedures, and orders are all accurate and complete.  Jarold Motto, PA-C

## 2023-01-13 ENCOUNTER — Encounter: Payer: Self-pay | Admitting: Family

## 2023-01-17 ENCOUNTER — Other Ambulatory Visit (HOSPITAL_COMMUNITY): Payer: Self-pay

## 2023-01-27 ENCOUNTER — Encounter: Payer: Self-pay | Admitting: Physician Assistant

## 2023-01-27 NOTE — Telephone Encounter (Signed)
Please see patient message and advise recommendations for patient

## 2023-01-28 ENCOUNTER — Other Ambulatory Visit: Payer: Self-pay

## 2023-01-28 MED ORDER — SERTRALINE HCL 100 MG PO TABS: 100.0000 mg | ORAL_TABLET | Freq: Every day | ORAL | 1 refills | Status: DC

## 2023-04-07 ENCOUNTER — Encounter: Payer: Self-pay | Admitting: Physician Assistant

## 2023-04-07 ENCOUNTER — Other Ambulatory Visit (HOSPITAL_COMMUNITY): Payer: Self-pay

## 2023-04-07 ENCOUNTER — Other Ambulatory Visit: Payer: Self-pay

## 2023-04-13 ENCOUNTER — Encounter: Payer: BC Managed Care – PPO | Admitting: Physician Assistant

## 2023-04-13 ENCOUNTER — Other Ambulatory Visit (HOSPITAL_COMMUNITY): Payer: Self-pay

## 2023-04-18 ENCOUNTER — Other Ambulatory Visit (HOSPITAL_COMMUNITY): Payer: Self-pay

## 2023-04-18 NOTE — Telephone Encounter (Signed)
 Pt would like to go back on Ozempic. Please advise dose and does pt continue Metformin or stop once starts Ozempic?

## 2023-04-19 ENCOUNTER — Other Ambulatory Visit: Payer: Self-pay | Admitting: Physician Assistant

## 2023-04-19 ENCOUNTER — Encounter: Payer: Self-pay | Admitting: Physician Assistant

## 2023-04-19 ENCOUNTER — Telehealth: Payer: Self-pay | Admitting: *Deleted

## 2023-04-19 DIAGNOSIS — E119 Type 2 diabetes mellitus without complications: Secondary | ICD-10-CM

## 2023-04-19 HISTORY — DX: Type 2 diabetes mellitus without complications: E11.9

## 2023-04-19 MED ORDER — OZEMPIC (0.25 OR 0.5 MG/DOSE) 2 MG/3ML ~~LOC~~ SOPN: 0.2500 mg | PEN_INJECTOR | SUBCUTANEOUS | 2 refills | Status: DC

## 2023-04-19 NOTE — Telephone Encounter (Signed)
 PA needed for Ozempic 0.25 mg.

## 2023-04-20 ENCOUNTER — Other Ambulatory Visit (HOSPITAL_COMMUNITY): Payer: Self-pay

## 2023-04-20 ENCOUNTER — Telehealth: Payer: Self-pay

## 2023-04-20 NOTE — Telephone Encounter (Signed)
 Pharmacy Patient Advocate Encounter   Received notification from Pt Calls Messages that prior authorization for Ozempic  2mg /25ml is required/requested.   Insurance verification completed.   The patient is insured through [facility] .   Per test claim: PA required; PA submitted to above mentioned insurance via CoverMyMeds Key/confirmation #/EOC [id number] Status is pending

## 2023-04-21 ENCOUNTER — Other Ambulatory Visit (HOSPITAL_COMMUNITY): Payer: Self-pay

## 2023-04-26 ENCOUNTER — Other Ambulatory Visit (HOSPITAL_COMMUNITY): Payer: Self-pay

## 2023-04-26 DIAGNOSIS — Z1231 Encounter for screening mammogram for malignant neoplasm of breast: Secondary | ICD-10-CM | POA: Diagnosis not present

## 2023-04-26 DIAGNOSIS — Z713 Dietary counseling and surveillance: Secondary | ICD-10-CM | POA: Diagnosis not present

## 2023-04-26 DIAGNOSIS — Z6841 Body Mass Index (BMI) 40.0 and over, adult: Secondary | ICD-10-CM | POA: Diagnosis not present

## 2023-04-26 MED ORDER — ONDANSETRON 4 MG PO TBDP
8.0000 mg | ORAL_TABLET | Freq: Two times a day (BID) | ORAL | 1 refills | Status: DC
  Filled 2023-04-26: qty 10, 3d supply, fill #0

## 2023-04-26 NOTE — Telephone Encounter (Signed)
Left message on voicemail to call office. If patient calls back please tell her Ozempic has been approved for one year.

## 2023-04-26 NOTE — Telephone Encounter (Signed)
Please PA approval and advise patient

## 2023-04-26 NOTE — Telephone Encounter (Signed)
Pharmacy Patient Advocate Encounter  Received notification from [facility] that Prior Authorization for Ozempic 2mg /32ml has been APPROVED from 04/20/23 to 04/20/24   PA #/Case ID/Reference #: [id number]

## 2023-04-28 ENCOUNTER — Other Ambulatory Visit: Payer: Self-pay | Admitting: Obstetrics and Gynecology

## 2023-04-28 DIAGNOSIS — R928 Other abnormal and inconclusive findings on diagnostic imaging of breast: Secondary | ICD-10-CM

## 2023-05-06 ENCOUNTER — Other Ambulatory Visit (HOSPITAL_COMMUNITY): Payer: Self-pay

## 2023-05-12 ENCOUNTER — Ambulatory Visit
Admission: RE | Admit: 2023-05-12 | Discharge: 2023-05-12 | Disposition: A | Discharge status: Home / Self Care | Payer: BC Managed Care – PPO | Source: Ambulatory Visit | Attending: Obstetrics and Gynecology | Admitting: Obstetrics and Gynecology

## 2023-05-12 ENCOUNTER — Other Ambulatory Visit: Payer: Self-pay | Admitting: Obstetrics and Gynecology

## 2023-05-12 DIAGNOSIS — N6489 Other specified disorders of breast: Secondary | ICD-10-CM

## 2023-05-12 DIAGNOSIS — R928 Other abnormal and inconclusive findings on diagnostic imaging of breast: Secondary | ICD-10-CM

## 2023-05-12 DIAGNOSIS — N6310 Unspecified lump in the right breast, unspecified quadrant: Secondary | ICD-10-CM | POA: Diagnosis not present

## 2023-05-17 ENCOUNTER — Other Ambulatory Visit (HOSPITAL_COMMUNITY): Payer: Self-pay

## 2023-05-17 MED ORDER — OZEMPIC (0.25 OR 0.5 MG/DOSE) 2 MG/3ML ~~LOC~~ SOPN
0.5000 mg | PEN_INJECTOR | SUBCUTANEOUS | 1 refills | Status: DC
  Filled 2023-05-17: qty 3, 28d supply, fill #0

## 2023-05-18 ENCOUNTER — Other Ambulatory Visit (HOSPITAL_COMMUNITY): Payer: Self-pay

## 2023-05-27 ENCOUNTER — Other Ambulatory Visit: Payer: Self-pay

## 2023-05-27 ENCOUNTER — Other Ambulatory Visit (HOSPITAL_COMMUNITY): Payer: Self-pay

## 2023-05-27 ENCOUNTER — Other Ambulatory Visit: Payer: Self-pay | Admitting: Physician Assistant

## 2023-05-27 MED ORDER — ACCU-CHEK GUIDE W/DEVICE KIT
PACK | 0 refills | Status: AC
  Filled 2023-05-27: qty 1, 28d supply, fill #0
  Filled 2023-07-18: qty 1, 30d supply, fill #0

## 2023-05-27 MED ORDER — METFORMIN HCL ER 500 MG PO TB24
500.0000 mg | ORAL_TABLET | Freq: Every day | ORAL | 1 refills | Status: DC
  Filled 2023-05-27: qty 90, 90d supply, fill #0

## 2023-05-30 ENCOUNTER — Other Ambulatory Visit (HOSPITAL_COMMUNITY): Payer: Self-pay

## 2023-05-30 ENCOUNTER — Other Ambulatory Visit: Payer: Self-pay

## 2023-06-02 ENCOUNTER — Other Ambulatory Visit (HOSPITAL_COMMUNITY): Payer: Self-pay

## 2023-06-30 ENCOUNTER — Other Ambulatory Visit (HOSPITAL_COMMUNITY): Payer: Self-pay

## 2023-06-30 MED ORDER — OZEMPIC (0.25 OR 0.5 MG/DOSE) 2 MG/3ML ~~LOC~~ SOPN
0.7500 mg | PEN_INJECTOR | SUBCUTANEOUS | 0 refills | Status: DC
Start: 2023-06-30 — End: 2023-07-19
  Filled 2023-06-30: qty 3, 28d supply, fill #0

## 2023-07-18 ENCOUNTER — Other Ambulatory Visit (HOSPITAL_COMMUNITY): Payer: Self-pay

## 2023-07-18 ENCOUNTER — Other Ambulatory Visit: Payer: Self-pay | Admitting: Physician Assistant

## 2023-07-18 ENCOUNTER — Other Ambulatory Visit: Payer: Self-pay

## 2023-07-18 NOTE — Telephone Encounter (Signed)
 Pt requesting refill for Vyvanse 30 mg. Last OV 01/11/2023.

## 2023-07-19 ENCOUNTER — Other Ambulatory Visit (HOSPITAL_COMMUNITY): Payer: Self-pay

## 2023-07-19 MED ORDER — OZEMPIC (0.25 OR 0.5 MG/DOSE) 2 MG/3ML ~~LOC~~ SOPN
0.7500 mg | PEN_INJECTOR | SUBCUTANEOUS | 0 refills | Status: DC
  Filled 2023-07-19 – 2023-07-21 (×2): qty 3, 28d supply, fill #0

## 2023-07-20 ENCOUNTER — Other Ambulatory Visit: Payer: Self-pay | Admitting: Physician Assistant

## 2023-07-20 NOTE — Telephone Encounter (Signed)
 LOV 01/11/2023  Last refill 03/12/2023

## 2023-07-21 ENCOUNTER — Other Ambulatory Visit (HOSPITAL_COMMUNITY): Payer: Self-pay

## 2023-07-21 ENCOUNTER — Other Ambulatory Visit: Payer: Self-pay | Admitting: Physician Assistant

## 2023-07-21 ENCOUNTER — Encounter: Payer: Self-pay | Admitting: Physician Assistant

## 2023-07-21 NOTE — Telephone Encounter (Signed)
 Duplicate request for Vyvanse. Refused yesterday pt needs an appt.

## 2023-07-22 ENCOUNTER — Other Ambulatory Visit (HOSPITAL_COMMUNITY): Payer: Self-pay

## 2023-07-25 ENCOUNTER — Other Ambulatory Visit (HOSPITAL_COMMUNITY): Payer: Self-pay

## 2023-07-25 MED ORDER — OZEMPIC (1 MG/DOSE) 4 MG/3ML ~~LOC~~ SOPN
1.0000 mg | PEN_INJECTOR | SUBCUTANEOUS | 0 refills | Status: DC
  Filled 2023-07-25: qty 3, 28d supply, fill #0

## 2023-07-26 ENCOUNTER — Encounter: Payer: Self-pay | Admitting: Physician Assistant

## 2023-07-26 ENCOUNTER — Other Ambulatory Visit (HOSPITAL_COMMUNITY): Payer: Self-pay

## 2023-07-26 ENCOUNTER — Ambulatory Visit: Admitting: Physician Assistant

## 2023-07-26 VITALS — BP 110/80 | HR 82 | Temp 97.2°F | Ht 62.0 in | Wt 242.4 lb

## 2023-07-26 DIAGNOSIS — E119 Type 2 diabetes mellitus without complications: Secondary | ICD-10-CM

## 2023-07-26 DIAGNOSIS — F5081 Binge eating disorder, mild: Secondary | ICD-10-CM

## 2023-07-26 DIAGNOSIS — Z7984 Long term (current) use of oral hypoglycemic drugs: Secondary | ICD-10-CM | POA: Diagnosis not present

## 2023-07-26 DIAGNOSIS — F419 Anxiety disorder, unspecified: Secondary | ICD-10-CM | POA: Diagnosis not present

## 2023-07-26 LAB — POCT GLYCOSYLATED HEMOGLOBIN (HGB A1C): Hemoglobin A1C: 5.7 % — AB (ref 4.0–5.6)

## 2023-07-26 MED ORDER — LISDEXAMFETAMINE DIMESYLATE 30 MG PO CAPS
30.0000 mg | ORAL_CAPSULE | Freq: Every day | ORAL | 0 refills | Status: DC
  Filled 2023-07-26: qty 30, 30d supply, fill #0

## 2023-07-26 MED ORDER — LISDEXAMFETAMINE DIMESYLATE 30 MG PO CAPS
30.0000 mg | ORAL_CAPSULE | Freq: Every day | ORAL | 0 refills | Status: DC
  Filled 2023-09-09: qty 30, 30d supply, fill #0

## 2023-07-26 MED ORDER — SERTRALINE HCL 100 MG PO TABS: 50.0000 mg | ORAL_TABLET | Freq: Every day | ORAL | Status: DC

## 2023-07-26 MED ORDER — LISDEXAMFETAMINE DIMESYLATE 30 MG PO CAPS: 30.0000 mg | ORAL_CAPSULE | Freq: Every day | ORAL | 0 refills | Status: DC

## 2023-07-26 NOTE — Progress Notes (Signed)
 Tracey Kent is a 43 y.o. female here for a follow up of a pre-existing problem.  History of Present Illness:   Chief Complaint  Patient presents with   Anxiety   Binge eating disorder    Pt is currently taking Vyvanse  30 mg daily, needs refill   Anxiety / Mild binge-eating disorder: Pt is on Vyvanse  30 mg once daily and Zoloft  100 mg once daily.  Tolerating well with good clinical effect with Vyvanse  when taking daily.  She does reports about 2 missed doses per week, mostly due to increased work stress. Overall, her condition is well controlled with no major concerns.   Diabetes: Pt is on metformin  500 mg once daily and recently had her Semaglutide  increased to 1 mg once weekly by Tracey Kent at [facility].  She has not yet taken her 1 mg dose, plans to pick up later today.   Past Medical History:  Diagnosis Date   Anemia    Anxiety    Diabetes mellitus without complication (HCC) 04/19/2023   Hyperlipidemia    Psoriasis    Psoriatic arthritis (HCC) 03/20/2021   Vaginal delivery 2013     Social History   Tobacco Use   Smoking status: Former    Current packs/day: 0.00    Average packs/day: 1 pack/day for 5.0 years (5.0 ttl pk-yrs)    Types: Cigarettes    Start date: 05/10/2006    Quit date: 05/11/2011    Years since quitting: 12.2   Smokeless tobacco: Never  Vaping Use   Vaping status: Never Used  Substance Use Topics   Alcohol use: Yes    Alcohol/week: 1.0 standard drink of alcohol    Types: 1 Standard drinks or equivalent per week    Comment: Socially   Drug use: No    Past Surgical History:  Procedure Laterality Date   NO PAST SURGERIES      Family History  Problem Relation Age of Onset   Cancer Maternal Grandfather        unknown type   Diabetes Paternal Aunt    Pancreatic cancer Other     No Known Allergies  Current Medications:   Current Outpatient Medications:    atorvastatin  (LIPITOR) 80 MG tablet, Take 1 tablet (80 mg total) by  mouth daily., Disp: 90 tablet, Rfl: 3   Blood Glucose Monitoring Suppl (ACCU-CHEK GUIDE) w/Device KIT, USE TO CHECK BLOOD SUGAR, Disp: 1 kit, Rfl: 0   lisdexamfetamine (VYVANSE ) 30 MG capsule, Take 1 capsule (30 mg total) by mouth daily before breakfast., Disp: 30 capsule, Rfl: 0   [START ON 08/25/2023] lisdexamfetamine (VYVANSE ) 30 MG capsule, Take 1 capsule (30 mg total) by mouth daily before breakfast., Disp: 30 capsule, Rfl: 0   [START ON 09/24/2023] lisdexamfetamine (VYVANSE ) 30 MG capsule, Take 1 capsule (30 mg total) by mouth daily before breakfast., Disp: 30 capsule, Rfl: 0   metFORMIN  (GLUCOPHAGE -XR) 500 MG 24 hr tablet, Take 1 tablet (500 mg total) by mouth daily with breakfast., Disp: 90 tablet, Rfl: 1   Semaglutide , 1 MG/DOSE, (OZEMPIC , 1 MG/DOSE,) 4 MG/3ML SOPN, Inject 1 mg into the skin once a week., Disp: 3 mL, Rfl: 0   sertraline  (ZOLOFT ) 100 MG tablet, Take 1 tablet (100 mg total) by mouth daily., Disp: 90 tablet, Rfl: 1   Review of Systems:   Negative unless otherwise specified per HPI.  Vitals:   Vitals:   07/26/23 1312  BP: 110/80  Pulse: 82  Temp: (!) 97.2 F (36.2 C)  TempSrc: Temporal  Weight: 242 lb 6.1 oz (109.9 kg)  Height: 5\' 2"  (1.575 m)     Body mass index is 44.33 kg/m.  Physical Exam:   Physical Exam Vitals and nursing note reviewed.  Constitutional:      General: She is not in acute distress.    Appearance: She is well-developed. She is not ill-appearing or toxic-appearing.  Cardiovascular:     Rate and Rhythm: Normal rate and regular rhythm.     Pulses: Normal pulses.     Heart sounds: Normal heart sounds, S1 normal and S2 normal.  Pulmonary:     Effort: Pulmonary effort is normal.     Breath sounds: Normal breath sounds.  Skin:    General: Skin is warm and dry.  Neurological:     Mental Status: She is alert.     GCS: GCS eye subscore is 4. GCS verbal subscore is 5. GCS motor subscore is 6.  Psychiatric:        Speech: Speech normal.         Behavior: Behavior normal. Behavior is cooperative.     Assessment and Plan:   1. Diabetes mellitus without complication (HCC) (Primary) - POCT glycosylated hemoglobin (Hb A1C) Improved Will update additional blood work panel and urine panel at next visit Continue plan to increase Ozempic  to 1 mg Hold metformin  Follow up in 3 month(s), sooner if concerns   2. Anxiety Well controlled She is going to trial decrease to 50 mg daily Follow up in 3 month(s), sooner if concerns  3. Mild binge-eating disorder Stable but interested in possible dose increase We discussed trialing increase in Ozempic  1 mg and after a few weeks of this, if she feels like her food noise is still not controlled, we will increase dosage of Vyvanse  30 mg Follow up in 3 month(s), sooner if concerns  I, Tracey Kent, acting as a Neurosurgeon for Tracey Kent, [place]., have documented all relevant documentation on the behalf of Tracey Kent, [place], as directed by  Tracey Iba, PA while in the presence of Tracey Kent, [place].  I, Tracey Kent, [place], have reviewed all documentation for this visit. The documentation on 07/26/23 for the exam, diagnosis, procedures, and orders are all accurate and complete.  Tracey Iba, PA-C

## 2023-07-26 NOTE — Patient Instructions (Signed)
 It was great to see you!  Decrease Zoloft  to 50 mg daily Hold metformin  Increase Ozempic  to 1 mg   Let's follow-up in 3 months, sooner if you have concerns.  Take care,  Alexander Iba PA-C

## 2023-08-15 ENCOUNTER — Other Ambulatory Visit (HOSPITAL_COMMUNITY): Payer: Self-pay

## 2023-08-22 ENCOUNTER — Other Ambulatory Visit: Payer: Self-pay | Admitting: Physician Assistant

## 2023-08-23 ENCOUNTER — Other Ambulatory Visit (HOSPITAL_COMMUNITY): Payer: Self-pay

## 2023-08-23 MED ORDER — OZEMPIC (1 MG/DOSE) 4 MG/3ML ~~LOC~~ SOPN
1.0000 mg | PEN_INJECTOR | SUBCUTANEOUS | 0 refills | Status: AC
Start: 2023-08-23 — End: ?
  Filled 2023-08-23: qty 3, 28d supply, fill #0

## 2023-09-08 ENCOUNTER — Other Ambulatory Visit: Payer: Self-pay | Admitting: Physician Assistant

## 2023-09-08 NOTE — Telephone Encounter (Signed)
 Pt requesting refill for Vyvanse  30 mg capsule. Last OV 07/26/2023, scheduled 10/25/2023.

## 2023-09-09 ENCOUNTER — Other Ambulatory Visit (HOSPITAL_COMMUNITY): Payer: Self-pay

## 2023-09-21 ENCOUNTER — Other Ambulatory Visit: Payer: Self-pay

## 2023-09-21 ENCOUNTER — Other Ambulatory Visit (HOSPITAL_COMMUNITY): Payer: Self-pay

## 2023-09-21 MED ORDER — OZEMPIC (1 MG/DOSE) 4 MG/3ML ~~LOC~~ SOPN
1.0000 mg | PEN_INJECTOR | SUBCUTANEOUS | 2 refills | Status: DC
  Filled 2023-09-21: qty 3, 28d supply, fill #0
  Filled 2023-10-24: qty 3, 28d supply, fill #1

## 2023-09-22 ENCOUNTER — Other Ambulatory Visit (HOSPITAL_COMMUNITY): Payer: Self-pay

## 2023-10-25 ENCOUNTER — Ambulatory Visit (INDEPENDENT_AMBULATORY_CARE_PROVIDER_SITE_OTHER): Admitting: Physician Assistant

## 2023-10-25 ENCOUNTER — Encounter: Payer: Self-pay | Admitting: Physician Assistant

## 2023-10-25 ENCOUNTER — Other Ambulatory Visit: Payer: Self-pay

## 2023-10-25 ENCOUNTER — Other Ambulatory Visit (HOSPITAL_COMMUNITY): Payer: Self-pay

## 2023-10-25 VITALS — BP 120/86 | HR 81 | Temp 97.0°F | Ht 62.0 in | Wt 235.0 lb

## 2023-10-25 DIAGNOSIS — E119 Type 2 diabetes mellitus without complications: Secondary | ICD-10-CM | POA: Diagnosis not present

## 2023-10-25 DIAGNOSIS — E669 Obesity, unspecified: Secondary | ICD-10-CM | POA: Diagnosis not present

## 2023-10-25 DIAGNOSIS — F5081 Binge eating disorder, mild: Secondary | ICD-10-CM

## 2023-10-25 DIAGNOSIS — Z0000 Encounter for general adult medical examination without abnormal findings: Secondary | ICD-10-CM | POA: Diagnosis not present

## 2023-10-25 DIAGNOSIS — Z7985 Long-term (current) use of injectable non-insulin antidiabetic drugs: Secondary | ICD-10-CM | POA: Diagnosis not present

## 2023-10-25 DIAGNOSIS — E785 Hyperlipidemia, unspecified: Secondary | ICD-10-CM

## 2023-10-25 DIAGNOSIS — N920 Excessive and frequent menstruation with regular cycle: Secondary | ICD-10-CM | POA: Insufficient documentation

## 2023-10-25 DIAGNOSIS — Z1159 Encounter for screening for other viral diseases: Secondary | ICD-10-CM

## 2023-10-25 LAB — CBC WITH DIFFERENTIAL/PLATELET
Basophils Absolute: 0.1 K/uL (ref 0.0–0.1)
Basophils Relative: 0.9 % (ref 0.0–3.0)
Eosinophils Absolute: 0.2 K/uL (ref 0.0–0.7)
Eosinophils Relative: 2.4 % (ref 0.0–5.0)
HCT: 36.6 % (ref 36.0–46.0)
Hemoglobin: 11.9 g/dL — ABNORMAL LOW (ref 12.0–15.0)
Lymphocytes Relative: 25.6 % (ref 12.0–46.0)
Lymphs Abs: 2 K/uL (ref 0.7–4.0)
MCHC: 32.5 g/dL (ref 30.0–36.0)
MCV: 80.5 fl (ref 78.0–100.0)
Monocytes Absolute: 0.5 K/uL (ref 0.1–1.0)
Monocytes Relative: 6.9 % (ref 3.0–12.0)
Neutro Abs: 5 K/uL (ref 1.4–7.7)
Neutrophils Relative %: 64.2 % (ref 43.0–77.0)
Platelets: 449 K/uL — ABNORMAL HIGH (ref 150.0–400.0)
RBC: 4.55 Mil/uL (ref 3.87–5.11)
RDW: 14.7 % (ref 11.5–15.5)
WBC: 7.7 K/uL (ref 4.0–10.5)

## 2023-10-25 LAB — COMPREHENSIVE METABOLIC PANEL WITH GFR
ALT: 12 U/L (ref 0–35)
AST: 13 U/L (ref 0–37)
Albumin: 4.1 g/dL (ref 3.5–5.2)
Alkaline Phosphatase: 85 U/L (ref 39–117)
BUN: 11 mg/dL (ref 6–23)
CO2: 30 meq/L (ref 19–32)
Calcium: 9 mg/dL (ref 8.4–10.5)
Chloride: 101 meq/L (ref 96–112)
Creatinine, Ser: 0.62 mg/dL (ref 0.40–1.20)
GFR: 110.19 mL/min (ref >60.00)
Glucose, Bld: 93 mg/dL (ref 70–99)
Potassium: 4.2 meq/L (ref 3.5–5.1)
Sodium: 137 meq/L (ref 135–145)
Total Bilirubin: 0.4 mg/dL (ref 0.2–1.2)
Total Protein: 7 g/dL (ref 6.0–8.3)

## 2023-10-25 LAB — MICROALBUMIN / CREATININE URINE RATIO
Creatinine,U: 112.7 mg/dL
Microalb Creat Ratio: UNDETERMINED mg/g (ref 0.0–30.0)
Microalb, Ur: <0.7 mg/dL

## 2023-10-25 LAB — LIPID PANEL
Cholesterol: 174 mg/dL (ref 0–200)
HDL: 45.1 mg/dL (ref >39.00)
LDL Cholesterol: 96 mg/dL (ref 0–99)
NonHDL: 128.59
Total CHOL/HDL Ratio: 4
Triglycerides: 163 mg/dL — ABNORMAL HIGH (ref 0.0–149.0)
VLDL: 32.6 mg/dL (ref 0.0–40.0)

## 2023-10-25 LAB — HEMOGLOBIN A1C: Hgb A1c MFr Bld: 6.1 % (ref 4.6–6.5)

## 2023-10-25 MED ORDER — LISDEXAMFETAMINE DIMESYLATE 30 MG PO CAPS: 30.0000 mg | ORAL_CAPSULE | Freq: Every day | ORAL | 0 refills | Status: DC

## 2023-10-25 MED ORDER — LISDEXAMFETAMINE DIMESYLATE 30 MG PO CAPS
30.0000 mg | ORAL_CAPSULE | Freq: Every day | ORAL | 0 refills | Status: DC
  Filled 2023-12-21: qty 30, 30d supply, fill #0

## 2023-10-25 MED ORDER — LISDEXAMFETAMINE DIMESYLATE 30 MG PO CAPS
30.0000 mg | ORAL_CAPSULE | Freq: Every day | ORAL | 0 refills | Status: DC
  Filled 2023-10-25: qty 30, 30d supply, fill #0

## 2023-10-25 MED ORDER — SEMAGLUTIDE (2 MG/DOSE) 8 MG/3ML ~~LOC~~ SOPN
2.0000 mg | PEN_INJECTOR | SUBCUTANEOUS | 1 refills | Status: DC
  Filled 2023-10-25: qty 3, 28d supply, fill #0
  Filled 2023-10-27: qty 9, 28d supply, fill #0
  Filled 2023-10-27: qty 3, 28d supply, fill #0
  Filled 2023-11-21: qty 3, 28d supply, fill #1
  Filled 2023-12-21: qty 3, 28d supply, fill #2
  Filled 2024-01-19: qty 3, 28d supply, fill #3
  Filled 2024-02-21: qty 3, 28d supply, fill #4
  Filled 2024-03-19: qty 3, 28d supply, fill #5

## 2023-10-25 NOTE — Progress Notes (Signed)
 Subjective:    Tracey Kent is a 43 y.o. female and is here for a comprehensive physical exam.  HPI  Health Maintenance Due  Topic Date Due   Diabetic kidney evaluation - Urine ACR  Never done   Hepatitis C Screening  Never done   Diabetic kidney evaluation - eGFR measurement  03/20/2022    Acute Concerns: None.  Chronic Issues: Diabetes  Pt is on Ozempic  1 mg weekly. Good compliance and tolerance. She had constipation on Ozempic  at first, but not currently. She currently feels that she has reached a plateau on Ozempic  1 mg and is interested in a dose increase.  Mild binge-eating disorder  Pt is on Vyvanse  30 mg daily. Good compliance and tolerance. Eating habits are well-managed. She notes that she started taking her medication later in the morning at 11 AM. This helps prevent stress eating after she gets home from work. Denies difficulty sleeping. Pt would like a refill.  HLD  Pt is on Lipitor 80 mg daily. Good compliance and tolerance. She endorses working on a healthier diet and regularly exercising. Pt would like to taper off this medication in the future.  Health Maintenance: Immunizations -- See above. Colonoscopy -- N/a Mammogram -- UTD, screening done 04/26/2023 and diagnostic done 05/12/2023 of right breast. Results show two low-density circumscribed outer right breast masses, without sonographic correlates, likely a benign process. 6 month repeat recommended. PAP -- UTD, 10/29/2022 and NILM. Next due in 2029. Bone Density -- N/a Diet -- Overall well-balanced diet. Exercise -- Regular exercise daily, 15-20 mins daily.  Sleep habits -- Good sleep habits. Mood -- Stable.  UTD with dentist? - UTD UTD with eye doctor? - UTD UTD with dermatologist? - UTD  Weight history: Wt Readings from Last 10 Encounters:  10/25/23 235 lb (106.6 kg)  07/26/23 242 lb 6.1 oz (109.9 kg)  01/11/23 255 lb 8 oz (115.9 kg)  07/14/21 227 lb (103 kg)  03/20/21 238 lb (108  kg)  01/31/12 229 lb (103.9 kg)   Body mass index is 42.98 kg/m. Patient's last menstrual period was 10/13/2023 (exact date).  Alcohol use:  reports current alcohol use of about 1.0 standard drink of alcohol per week.  Tobacco use:  Tobacco Use: Medium Risk (10/25/2023)   Patient History    Smoking Tobacco Use: Former    Smokeless Tobacco Use: Never    Passive Exposure: Not on file   Eligible for lung cancer screening? no     10/25/2023    8:18 AM  Depression screen PHQ 2/9  Decreased Interest 0  Down, Depressed, Hopeless 0  PHQ - 2 Score 0     Other providers/specialists: Patient Care Team: Job Lukes, [place] as PCP - General (Physician Assistant)    PMHx, SurgHx, SocialHx, Medications, and Allergies were reviewed in the Visit Navigator and updated as appropriate.   Past Medical History:  Diagnosis Date   Anemia    Anxiety    Diabetes mellitus without complication (HCC) 04/19/2023   Hyperlipidemia    Psoriasis    Psoriatic arthritis (HCC) 03/20/2021   Vaginal delivery 2013     Past Surgical History:  Procedure Laterality Date   NO PAST SURGERIES       Family History  Problem Relation Age of Onset   Cancer Maternal Grandfather        unknown type   Diabetes Paternal Aunt    Pancreatic cancer Other    Diabetes Father    Hyperlipidemia Father  Diabetes Paternal Aunt     Social History   Tobacco Use   Smoking status: Former    Current packs/day: 0.00    Average packs/day: 1 pack/day for 5.0 years (5.0 ttl pk-yrs)    Types: Cigarettes    Start date: 05/10/2006    Quit date: 05/11/2011    Years since quitting: 12.4   Smokeless tobacco: Never  Vaping Use   Vaping status: Never Used  Substance Use Topics   Alcohol use: Yes    Alcohol/week: 1.0 standard drink of alcohol    Types: 1 Standard drinks or equivalent per week    Comment: Socially maybe once a month   Drug use: No    Review of Systems:   Review of Systems  Constitutional:   Negative for chills, fever, malaise/fatigue and weight loss.  HENT:  Negative for hearing loss, sinus pain and sore throat.   Respiratory:  Negative for cough and hemoptysis.   Cardiovascular:  Negative for chest pain, palpitations, leg swelling and PND.  Gastrointestinal:  Negative for abdominal pain, constipation, diarrhea, heartburn, nausea and vomiting.  Genitourinary:  Negative for dysuria, frequency and urgency.  Musculoskeletal:  Negative for back pain, myalgias and neck pain.  Skin:  Negative for itching and rash.  Neurological:  Negative for dizziness, tingling, seizures and headaches.  Endo/Heme/Allergies:  Negative for polydipsia.  Psychiatric/Behavioral:  Negative for depression. The patient is not nervous/anxious.     Objective:   BP 120/86 (BP Location: Left Arm, Patient Position: Sitting, Cuff Size: Large)   Pulse 81   Temp (!) 97 F (36.1 C) (Temporal)   Ht 5' 2 (1.575 m)   Wt 235 lb (106.6 kg)   LMP 10/13/2023 (Exact Date)   SpO2 95%   BMI 42.98 kg/m  Body mass index is 42.98 kg/m.   General Appearance:    Alert, cooperative, no distress, appears stated age  Head:    Normocephalic, without obvious abnormality, atraumatic  Eyes:    PERRL, conjunctiva/corneas clear, EOM's intact, fundi    benign, both eyes  Ears:    Normal TM's and external ear canals, both ears  Nose:   Nares normal, septum midline, mucosa normal, no drainage    or sinus tenderness  Throat:   Lips, mucosa, and tongue normal; teeth and gums normal  Neck:   Supple, symmetrical, trachea midline, no adenopathy;    thyroid :  no enlargement/tenderness/nodules; no carotid   bruit or JVD  Back:     Symmetric, no curvature, ROM normal, no CVA tenderness  Lungs:     Clear to auscultation bilaterally, respirations unlabored  Chest Wall:    No tenderness or deformity   Heart:    Regular rate and rhythm, S1 and S2 normal, no murmur, rub or gallop  Breast Exam:    Deferred  Abdomen:     Soft,  non-tender, bowel sounds active all four quadrants,    no masses, no organomegaly  Genitalia:    Deferred  Extremities:   Extremities normal, atraumatic, no cyanosis or edema  Pulses:   2+ and symmetric all extremities  Skin:   Skin color, texture, turgor normal, no rashes or lesions  Lymph nodes:   Cervical, supraclavicular, and axillary nodes normal  Neurologic:   CNII-XII intact, normal strength, sensation and reflexes    throughout    Assessment/Plan:   Routine physical examination Today patient counseled on age appropriate routine health concerns for screening and prevention, each reviewed and up to date or declined. Immunizations  reviewed and up to date or declined. Labs ordered and reviewed. Risk factors for depression reviewed and negative. Hearing function and visual acuity are intact. ADLs screened and addressed as needed. Functional ability and level of safety reviewed and appropriate. Education, counseling and referrals performed based on assessed risks today. Patient provided with a copy of personalized plan for preventive services.  Diabetes mellitus without complication (HCC); Long-term current use of injectable noninsulin antidiabetic medication Suspect well controlled Continue Ozempic  but increase to 2 mg weekly to help with weight loss Follow up in 6 month(s), sooner if concerns  Obesity, unspecified class, unspecified obesity type, unspecified whether serious comorbidity present Continue efforts at healthy lifestyle  Encounter for screening for other viral diseases Update hepatitis C screening  Hyperlipidemia, unspecified hyperlipidemia type Update lipid panel and adjust atorvastatin  80 mg as indicated  Mild binge-eating disorder Well controled PDMP reviewed during this encounter. Continue Vyvanse  30 mg daily Follow up in 3 month(s)    I, Lavern Simmers, acting as a Neurosurgeon for [employer], [place]., have documented all relevant documentation on the behalf of  Lucie Buttner, [place], as directed by Lucie Buttner, PA while in the presence of Lucie Buttner, [place].  I, Lucie Buttner, [place], have reviewed all documentation for this visit. The documentation on 10/25/23 for the exam, diagnosis, procedures, and orders are all accurate and complete.  Lucie Buttner, PA-C [facility]

## 2023-10-25 NOTE — Patient Instructions (Signed)
 It was great to see you!  Please go to the lab for blood work.   Our office will call you with your results unless you have chosen to receive results via MyChart.  If your blood work is normal we will follow-up each year for physicals and as scheduled for chronic medical problems.  If anything is abnormal we will treat accordingly and get you in for a follow-up.  Take care,  Lelon Mast

## 2023-10-26 ENCOUNTER — Ambulatory Visit: Payer: Self-pay | Admitting: Physician Assistant

## 2023-10-26 LAB — HEPATITIS C ANTIBODY: Hepatitis C Ab: NONREACTIVE

## 2023-10-27 ENCOUNTER — Other Ambulatory Visit (HOSPITAL_COMMUNITY): Payer: Self-pay

## 2023-10-27 ENCOUNTER — Telehealth (HOSPITAL_COMMUNITY): Payer: Self-pay

## 2023-10-27 ENCOUNTER — Other Ambulatory Visit: Payer: Self-pay

## 2023-10-27 NOTE — Telephone Encounter (Signed)
 PA request has been Approved. New Encounter has been or will be created for follow up. For additional info see Pharmacy Prior Auth telephone encounter from 10/27/23.

## 2023-11-15 ENCOUNTER — Encounter: Payer: Self-pay | Admitting: Obstetrics and Gynecology

## 2023-11-15 ENCOUNTER — Other Ambulatory Visit: Payer: Self-pay | Admitting: Obstetrics and Gynecology

## 2023-11-15 DIAGNOSIS — N6489 Other specified disorders of breast: Secondary | ICD-10-CM

## 2023-11-15 DIAGNOSIS — R928 Other abnormal and inconclusive findings on diagnostic imaging of breast: Secondary | ICD-10-CM

## 2023-11-21 ENCOUNTER — Other Ambulatory Visit: Payer: Self-pay | Admitting: Obstetrics and Gynecology

## 2023-11-21 ENCOUNTER — Ambulatory Visit
Admission: RE | Admit: 2023-11-21 | Discharge: 2023-11-21 | Disposition: A | Discharge status: Home / Self Care | Source: Ambulatory Visit | Attending: Obstetrics and Gynecology | Admitting: Obstetrics and Gynecology

## 2023-11-21 ENCOUNTER — Ambulatory Visit

## 2023-11-21 DIAGNOSIS — R928 Other abnormal and inconclusive findings on diagnostic imaging of breast: Secondary | ICD-10-CM

## 2023-11-21 DIAGNOSIS — N6489 Other specified disorders of breast: Secondary | ICD-10-CM

## 2023-12-21 ENCOUNTER — Other Ambulatory Visit (HOSPITAL_COMMUNITY): Payer: Self-pay

## 2023-12-21 ENCOUNTER — Other Ambulatory Visit: Payer: Self-pay

## 2024-01-23 ENCOUNTER — Other Ambulatory Visit (HOSPITAL_COMMUNITY): Payer: Self-pay

## 2024-01-26 ENCOUNTER — Ambulatory Visit: Admitting: Physician Assistant

## 2024-02-01 ENCOUNTER — Telehealth: Payer: Self-pay | Admitting: Physician Assistant

## 2024-02-01 NOTE — Telephone Encounter (Signed)
 LVM TO RS 02/13/24

## 2024-02-09 NOTE — Telephone Encounter (Signed)
 MAILED LETTER 02/09/24

## 2024-02-09 NOTE — Telephone Encounter (Signed)
 LVM TO RS 2X

## 2024-02-09 NOTE — Telephone Encounter (Signed)
 Left my chart message.

## 2024-02-13 ENCOUNTER — Ambulatory Visit: Admitting: Physician Assistant

## 2024-02-21 ENCOUNTER — Other Ambulatory Visit: Payer: Self-pay

## 2024-02-21 ENCOUNTER — Other Ambulatory Visit: Payer: Self-pay | Admitting: Physician Assistant

## 2024-02-21 NOTE — Telephone Encounter (Signed)
 Pt requesting refill for Vyvanse  30 mg capsule. Last OV 10/2023.

## 2024-02-22 ENCOUNTER — Other Ambulatory Visit (HOSPITAL_COMMUNITY): Payer: Self-pay

## 2024-02-26 ENCOUNTER — Other Ambulatory Visit: Payer: Self-pay | Admitting: Physician Assistant

## 2024-03-22 ENCOUNTER — Ambulatory Visit: Admitting: Physician Assistant

## 2024-03-23 ENCOUNTER — Ambulatory Visit: Admitting: Physician Assistant

## 2024-04-02 ENCOUNTER — Telehealth: Payer: Self-pay

## 2024-04-02 ENCOUNTER — Other Ambulatory Visit (HOSPITAL_COMMUNITY): Payer: Self-pay

## 2024-04-02 NOTE — Telephone Encounter (Signed)
 Pharmacy Patient Advocate Encounter   Received notification from Onbase that prior authorization for Ozempic  8 is required/requested.   Insurance verification completed.   The patient is insured through [facility].   Per test claim: PA required; PA submitted to above mentioned insurance via Latent Key/confirmation #/EOC [id number] Status is pending

## 2024-04-02 NOTE — Telephone Encounter (Signed)
 Pharmacy Patient Advocate Encounter  Received notification from [employer] that Prior Authorization for Ozempic  8 has been APPROVED from 04/02/24 to 04/02/25. Unable to obtain price due to refill too soon rejection, last fill date 03/19/25 next available fill date1/5/26   PA #/Case ID/Reference #: # [id number]

## 2024-04-03 MED ORDER — SEMAGLUTIDE (2 MG/DOSE) 8 MG/3ML ~~LOC~~ SOPN: 2.0000 mg | PEN_INJECTOR | SUBCUTANEOUS | 1 refills | Status: DC

## 2024-04-03 NOTE — Telephone Encounter (Signed)
"  New Rx sent to pharmacy  "

## 2024-04-12 ENCOUNTER — Other Ambulatory Visit (HOSPITAL_COMMUNITY): Payer: Self-pay

## 2024-04-12 ENCOUNTER — Ambulatory Visit: Admitting: Physician Assistant

## 2024-04-12 ENCOUNTER — Telehealth (HOSPITAL_COMMUNITY): Payer: Self-pay

## 2024-04-12 ENCOUNTER — Encounter: Payer: Self-pay | Admitting: Physician Assistant

## 2024-04-12 ENCOUNTER — Other Ambulatory Visit: Payer: Self-pay

## 2024-04-12 VITALS — BP 120/86 | HR 83 | Temp 97.7°F | Ht 62.0 in | Wt 229.0 lb

## 2024-04-12 DIAGNOSIS — F5081 Binge eating disorder, mild: Secondary | ICD-10-CM | POA: Diagnosis not present

## 2024-04-12 DIAGNOSIS — Z7985 Long-term (current) use of injectable non-insulin antidiabetic drugs: Secondary | ICD-10-CM | POA: Diagnosis not present

## 2024-04-12 DIAGNOSIS — F419 Anxiety disorder, unspecified: Secondary | ICD-10-CM

## 2024-04-12 DIAGNOSIS — E119 Type 2 diabetes mellitus without complications: Secondary | ICD-10-CM | POA: Diagnosis not present

## 2024-04-12 MED ORDER — LISDEXAMFETAMINE DIMESYLATE 30 MG PO CAPS: 30.0000 mg | ORAL_CAPSULE | Freq: Every day | ORAL | 0 refills | Status: AC

## 2024-04-12 MED ORDER — TIRZEPATIDE 7.5 MG/0.5ML ~~LOC~~ SOAJ
7.5000 mg | SUBCUTANEOUS | 1 refills | Status: AC
  Filled 2024-04-12 (×2): qty 2, 28d supply, fill #0

## 2024-04-12 MED ORDER — LISDEXAMFETAMINE DIMESYLATE 30 MG PO CAPS
30.0000 mg | ORAL_CAPSULE | Freq: Every day | ORAL | 0 refills | Status: AC
  Filled 2024-04-12: qty 30, 30d supply, fill #0

## 2024-04-12 NOTE — Telephone Encounter (Signed)
 Pharmacy Patient Advocate Encounter   Received notification from Pt Calls Messages that prior authorization for Mounjaro  7.5MG /0.5ML auto-injectors  is required/requested.   Insurance verification completed.   The patient is insured through [facility].   Per test claim: PA required; PA submitted to above mentioned insurance via Latent Key/confirmation #/EOC [id number] Status is pending

## 2024-04-12 NOTE — Progress Notes (Signed)
 "  History of Present Illness:   Chief Complaint  Patient presents with   Binge eating disorder    Pt here for f/u needs refill for Vyvanse  30 mg. Tolerating well.    Discussed the use of AI scribe software for clinical note transcription with the patient, who gave verbal consent to proceed.  History of Present Illness    Tracey Kent is a 44 year old female who presents for medication management of ADHD and obesity.  She takes Vyvanse  30 mg, which improves attention and controls binge eating without afternoon crash of attention issues. She is satisfied with the current dose and does not want to increase it.   She uses Ozempic  2 mg weekly with good effect for diabetes. Her home weight was recently 221 lb, with some fluctuation around holidays and menses. She had only mild side effects at the initial low dose and has no significant tolerance issues now.  She takes sertraline  100 mg daily without problems. She considered lowering the dose but chose to continue the current dose due to ongoing holiday and work stress.        Past Medical History:  Diagnosis Date   Anemia    Anxiety    Diabetes mellitus without complication (HCC) 04/19/2023   Hyperlipidemia    Psoriasis    Psoriatic arthritis (HCC) 03/20/2021   Vaginal delivery 2013     Social History[1]  Past Surgical History:  Procedure Laterality Date   NO PAST SURGERIES      Family History  Problem Relation Age of Onset   Cancer Maternal Grandfather        unknown type   Diabetes Paternal Aunt    Pancreatic cancer Other    Diabetes Father    Hyperlipidemia Father    Diabetes Paternal Aunt     Allergies[2]  Current Medications:  Current Medications[3]   Review of Systems:   Negative unless otherwise specified per HPI.  Vitals:   Vitals:   04/12/24 0944  BP: 120/86  Pulse: 83  Temp: 97.7 F (36.5 C)  TempSrc: Temporal  SpO2: 95%  Weight: 229 lb (103.9 kg)  Height: 5' 2 (1.575 m)     Body mass  index is 41.88 kg/m.  Physical Exam:   Physical Exam Vitals and nursing note reviewed.  Constitutional:      General: She is not in acute distress.    Appearance: She is well-developed. She is not ill-appearing or toxic-appearing.  Cardiovascular:     Rate and Rhythm: Normal rate and regular rhythm.     Pulses: Normal pulses.     Heart sounds: Normal heart sounds, S1 normal and S2 normal.  Pulmonary:     Effort: Pulmonary effort is normal.     Breath sounds: Normal breath sounds.  Skin:    General: Skin is warm and dry.  Neurological:     Mental Status: She is alert.     GCS: GCS eye subscore is 4. GCS verbal subscore is 5. GCS motor subscore is 6.  Psychiatric:        Speech: Speech normal.        Behavior: Behavior normal. Behavior is cooperative.     Assessment and Plan:   Assessment and Plan    Mild binge eating disorder Vyvanse  effective in managing binge eating symptoms. - Continue current management with Vyvanse  30 mg daily  Diabetes mellitus without complication (HCC)  Discussed switching to Mounjaro  for improved outcomes. Prefilled pens preferred. Prior authorization may be  required. - Switched from Ozempic  to Mounjaro , starting at 7.5 mg. - Monitor tolerance and effectiveness of Mounjaro . - Follow up in three months.  Anxiety Well controlled with Zoloft  100 mg She is considering reduction and can split tablets in half if needed Reports there is no suicidal ideation/hi      Follow up in 3 month(s), sooner if concerns   Lucie Buttner, PA-C    [1]  Social History Tobacco Use   Smoking status: Former    Current packs/day: 0.00    Average packs/day: 1 pack/day for 5.0 years (5.0 ttl pk-yrs)    Types: Cigarettes    Start date: 05/10/2006    Quit date: 05/11/2011    Years since quitting: 12.9   Smokeless tobacco: Never  Vaping Use   Vaping status: Never Used  Substance Use Topics   Alcohol use: Yes    Alcohol/week: 1.0 standard drink of alcohol     Types: 1 Standard drinks or equivalent per week    Comment: Socially maybe once a month   Drug use: No  [2] No Known Allergies [3]  Current Outpatient Medications:    atorvastatin  (LIPITOR) 80 MG tablet, Take 1 tablet (80 mg total) by mouth daily., Disp: 90 tablet, Rfl: 3   Blood Glucose Monitoring Suppl (ACCU-CHEK GUIDE) w/Device KIT, USE TO CHECK BLOOD SUGAR, Disp: 1 kit, Rfl: 0   lisdexamfetamine  (VYVANSE ) 30 MG capsule, Take 1 capsule (30 mg total) by mouth daily before breakfast., Disp: 30 capsule, Rfl: 0   [START ON 05/12/2024] lisdexamfetamine  (VYVANSE ) 30 MG capsule, Take 1 capsule (30 mg total) by mouth daily before breakfast., Disp: 30 capsule, Rfl: 0   [START ON 06/11/2024] lisdexamfetamine  (VYVANSE ) 30 MG capsule, Take 1 capsule (30 mg total) by mouth daily before breakfast., Disp: 30 capsule, Rfl: 0   sertraline  (ZOLOFT ) 100 MG tablet, TAKE 1 TABLET BY MOUTH EVERY DAY, Disp: 90 tablet, Rfl: 1   tirzepatide  (MOUNJARO ) 7.5 MG/0.5ML Pen, Inject 7.5 mg into the skin once a week., Disp: 6 mL, Rfl: 1  "

## 2024-04-12 NOTE — Patient Instructions (Signed)
 Wt Readings from Last 4 Encounters:  04/12/24 229 lb (103.9 kg)  10/25/23 235 lb (106.6 kg)  07/26/23 242 lb 6.1 oz (109.9 kg)  01/11/23 255 lb 8 oz (115.9 kg)

## 2024-04-12 NOTE — Telephone Encounter (Signed)
 PA request has been Received. New Encounter has been or will be created for follow up. For additional info see Pharmacy Prior Auth telephone encounter from 04/12/24.

## 2024-04-12 NOTE — Telephone Encounter (Signed)
 Pharmacy Patient Advocate Encounter  Received notification from [employer] that Prior Authorization for  Mounjaro  7.5MG /0.5ML auto-injectors  has been APPROVED from 04/12/24 to 04/12/25. Ran test claim, Copay is $25. This test claim was processed through [facility]- copay amounts may vary at other pharmacies due to pharmacy/plan contracts, or as the patient moves through the different stages of their insurance plan.   PA #/Case ID/Reference #: [id number]

## 2024-05-02 ENCOUNTER — Other Ambulatory Visit (HOSPITAL_COMMUNITY): Payer: Self-pay

## 2024-05-02 ENCOUNTER — Other Ambulatory Visit: Payer: Self-pay | Admitting: Physician Assistant

## 2024-05-02 MED ORDER — ATORVASTATIN CALCIUM 80 MG PO TABS
80.0000 mg | ORAL_TABLET | Freq: Every day | ORAL | 1 refills | Status: AC
  Filled 2024-05-02: qty 90, 90d supply, fill #0

## 2024-05-11 LAB — OPHTHALMOLOGY REPORT-SCANNED

## 2024-05-24 ENCOUNTER — Encounter
